# Patient Record
Sex: Female | Born: 1950
Health system: Southern US, Community
[De-identification: ages and names within clinical notes are randomized; demographics above are authoritative.]

## PROBLEM LIST (undated history)

## (undated) DIAGNOSIS — G4700 Insomnia, unspecified: Secondary | ICD-10-CM

## (undated) DIAGNOSIS — G309 Alzheimer's disease, unspecified: Secondary | ICD-10-CM

## (undated) DIAGNOSIS — F32A Depression, unspecified: Secondary | ICD-10-CM

## (undated) DIAGNOSIS — F0280 Dementia in other diseases classified elsewhere without behavioral disturbance: Secondary | ICD-10-CM

## (undated) DIAGNOSIS — F419 Anxiety disorder, unspecified: Secondary | ICD-10-CM

## (undated) DIAGNOSIS — J449 Chronic obstructive pulmonary disease, unspecified: Secondary | ICD-10-CM

## (undated) DIAGNOSIS — F329 Major depressive disorder, single episode, unspecified: Secondary | ICD-10-CM

## (undated) HISTORY — PX: CHOLECYSTECTOMY: SHX55

## (undated) HISTORY — DX: Insomnia, unspecified: G47.00

## (undated) HISTORY — DX: Anxiety disorder, unspecified: F41.9

## (undated) HISTORY — PX: ROTATOR CUFF REPAIR: SHX139

## (undated) HISTORY — DX: Alzheimer's disease, unspecified: G30.9

## (undated) HISTORY — PX: OTHER SURGICAL HISTORY: SHX169

## (undated) HISTORY — DX: Dementia in other diseases classified elsewhere without behavioral disturbance: F02.80

## (undated) HISTORY — PX: ABDOMINAL HYSTERECTOMY: SHX81

## (undated) HISTORY — DX: Major depressive disorder, single episode, unspecified: F32.9

## (undated) HISTORY — DX: Chronic obstructive pulmonary disease, unspecified: J44.9

## (undated) HISTORY — PX: APPENDECTOMY: SHX54

## (undated) HISTORY — DX: Depression, unspecified: F32.A

---

## 2006-05-20 DIAGNOSIS — F0280 Dementia in other diseases classified elsewhere without behavioral disturbance: Secondary | ICD-10-CM

## 2006-05-20 HISTORY — DX: Dementia in other diseases classified elsewhere, unspecified severity, without behavioral disturbance, psychotic disturbance, mood disturbance, and anxiety: F02.80

## 2010-08-29 LAB — HM MAMMOGRAPHY: HM Mammogram: NEGATIVE

## 2010-08-31 LAB — HM DEXA SCAN

## 2011-05-21 DIAGNOSIS — J449 Chronic obstructive pulmonary disease, unspecified: Secondary | ICD-10-CM

## 2011-05-21 HISTORY — DX: Chronic obstructive pulmonary disease, unspecified: J44.9

## 2013-07-26 ENCOUNTER — Encounter: Payer: Self-pay | Admitting: Internal Medicine

## 2013-07-26 ENCOUNTER — Ambulatory Visit (INDEPENDENT_AMBULATORY_CARE_PROVIDER_SITE_OTHER): Payer: 59 | Admitting: Internal Medicine

## 2013-07-26 VITALS — BP 124/82 | HR 61 | Temp 98.3°F | Resp 14 | Ht 62.25 in | Wt 179.8 lb

## 2013-07-26 DIAGNOSIS — W19XXXA Unspecified fall, initial encounter: Secondary | ICD-10-CM

## 2013-07-26 DIAGNOSIS — E538 Deficiency of other specified B group vitamins: Secondary | ICD-10-CM

## 2013-07-26 DIAGNOSIS — F32A Depression, unspecified: Secondary | ICD-10-CM | POA: Insufficient documentation

## 2013-07-26 DIAGNOSIS — F3289 Other specified depressive episodes: Secondary | ICD-10-CM

## 2013-07-26 DIAGNOSIS — Z13220 Encounter for screening for lipoid disorders: Secondary | ICD-10-CM

## 2013-07-26 DIAGNOSIS — G309 Alzheimer's disease, unspecified: Secondary | ICD-10-CM

## 2013-07-26 DIAGNOSIS — F410 Panic disorder [episodic paroxysmal anxiety] without agoraphobia: Secondary | ICD-10-CM | POA: Insufficient documentation

## 2013-07-26 DIAGNOSIS — G4700 Insomnia, unspecified: Secondary | ICD-10-CM

## 2013-07-26 DIAGNOSIS — R634 Abnormal weight loss: Secondary | ICD-10-CM

## 2013-07-26 DIAGNOSIS — J4489 Other specified chronic obstructive pulmonary disease: Secondary | ICD-10-CM

## 2013-07-26 DIAGNOSIS — F0280 Dementia in other diseases classified elsewhere without behavioral disturbance: Secondary | ICD-10-CM

## 2013-07-26 DIAGNOSIS — G300 Alzheimer's disease with early onset: Secondary | ICD-10-CM

## 2013-07-26 DIAGNOSIS — F329 Major depressive disorder, single episode, unspecified: Secondary | ICD-10-CM

## 2013-07-26 DIAGNOSIS — M159 Polyosteoarthritis, unspecified: Secondary | ICD-10-CM | POA: Insufficient documentation

## 2013-07-26 DIAGNOSIS — J449 Chronic obstructive pulmonary disease, unspecified: Secondary | ICD-10-CM | POA: Insufficient documentation

## 2013-07-26 MED ORDER — SERTRALINE HCL 50 MG PO TABS: 50.0000 mg | ORAL_TABLET | Freq: Every day | ORAL | Status: DC

## 2013-07-26 NOTE — Progress Notes (Signed)
Patient ID: Sherri StammerJacqueline Degan, female   DOB: 09/01/1950, 63 y.o.   MRN: [id number]   [facility]  [facility]  Code Status: DNR, brought HCPOA and living will paperwork to be scanned   Allergies  Allergen Reactions  . Codone [Hydrocodone]     Chief Complaint  Patient presents with  . Establish Care    NP Establish Care  . Immunizations    will wait on medical records  . other    having anxiety attacks, worsening dementia, plans on moving in to Assisted living-needs FL-2 form fillied out/DNR  . other    depression/MMSE done    HPI: Patient is a 63 y.o. female seen in the office today to establish with the practice.  She moved here at Thanksgiving.  She needs to establish.  Is agreeable to going to [facility] at the end of March.  Needs DNR, FL2, PPD.  Has been having what seem to be anxiety attacks.  Will be sitting on the cough, start shaking, will get something sweet to eat (b/c husband had been diabetic) and has been so worked up, she fell a couple of times.  Reviewed neuropsychological eval.  Weight loss had been an issue.  She had not been eating well for a 1.3728yrs by herself.  Looks better and healthier.  Sometimes does feel anxious.  Has some crying episodes and mood swings.  Facial expression with change immediately--angry at times.  She can't control it.  Sometimes appropriate mood for the moment.  Big adjustment due to rules of the house.  Keeps emotional response, but does not remember what about.  With trazodone, it seems she is sleeping at night.  No longer wandering.  Goes to bed b/w 91030 and gets up at 730.  Had a fall 2/21, January and once last week.  Had a swollen hand in August, Sept.  No fracture, but swollen for 2 mos.  Has bad oa of hands.    Had been in [facility] when her son was young--had back surgery and had disc problem and had surgery on her head, as well.  Both rotator cuffs replaced.  Also had carpal tunnel surgery.    COPD dx'd  2 years ago--had seen pulmonology.  No longer smoking since she's been here.  Got thru an infection this past season with dayquil.  Says she aches everywhere.  Massages hands and will look at them like they hurt.  Has been taking advil at [facility] for a week.    Has living will, hcpoa paperwork that was copied to be scanned.  Is clearly requesting DNR.    Has a sitter right now 6 hrs per day.  Feels like she is alone a long time.  When it was getting dark early, that made things worse.    Review of Systems:  Review of Systems  Constitutional: Positive for weight loss. Negative for fever and chills.  HENT: Negative for congestion and hearing loss.   Eyes: Negative for blurred vision.  Respiratory: Negative for cough and shortness of breath.   Cardiovascular: Negative for chest pain, palpitations and leg swelling.  Gastrointestinal: Positive for heartburn and constipation. Negative for abdominal pain, blood in stool and melena.  Genitourinary: Negative for dysuria, urgency and frequency.  Musculoskeletal: Positive for falls.  Skin: Negative for rash.  Neurological: Positive for sensory change. Negative for focal weakness, loss of consciousness, weakness and headaches.  Endo/Heme/Allergies: Does not bruise/bleed easily.  Psychiatric/Behavioral: Positive for depression and memory  loss. The patient is nervous/anxious and has insomnia.     Past Medical History  Diagnosis Date  . Anxiety   . Depression   . Alzheimer disease 2008  . Insomnia   . COPD (chronic obstructive pulmonary disease) 2013    Past Surgical History  Procedure Laterality Date  . Rotator cuff repair    . Carpeal tunnel repair    . Cholecystectomy    . Appendectomy    . Abdominal hysterectomy      Social History:   reports that she has never smoked. She has never used smokeless tobacco. She reports that she does not drink alcohol or use illicit drugs.  Family History  Problem Relation Age of Onset  . Leukemia Mother    . Stroke Father     Medications: Patient's Medications  New Prescriptions   No medications on file  Previous Medications   CYANOCOBALAMIN 2000 MCG TABLET    2,000 mcg. Taking 3000 mcg once daily for Vitamin B-12 Deficiency.   DONEPEZIL (ARICEPT) 10 MG TABLET    Take 1 tablet by mouth twice daily for Alzheimer's Disease   MEMANTINE (NAMENDA) 10 MG TABLET    Take 1 tablet by mouth twice daily for Alzheimer's Disease   SERTRALINE (ZOLOFT) 100 MG TABLET    Take 1 tablet daily for Anxiety Disorder   TRAZODONE (DESYREL) 100 MG TABLET    100 mg. Take 2 tablets at night for Insomnia  Modified Medications   No medications on file  Discontinued Medications   No medications on file     Physical Exam: Filed Vitals:   07/26/13 0920  BP: 124/82  Pulse: 61  Temp: 98.3 F (36.8 C)  TempSrc: Oral  Resp: 14  Height: 5' 2.25" (1.581 m)  Weight: 179 lb 12.8 oz (81.557 kg)  SpO2: 98%  Physical Exam  Constitutional: She appears well-developed and well-nourished. No distress.  HENT:  Head: Normocephalic and atraumatic.  Right Ear: External ear normal.  Left Ear: External ear normal.  Nose: Nose normal.  Mouth/Throat: Oropharynx is clear and moist. No oropharyngeal exudate.  Only mild cerumen left ear  Eyes: Conjunctivae and EOM are normal. Pupils are equal, round, and reactive to light.  Neck: Neck supple. No JVD present. No thyromegaly present.  Cardiovascular: Normal rate, regular rhythm, normal heart sounds and intact distal pulses.   Pulmonary/Chest: Effort normal and breath sounds normal. No respiratory distress.  Abdominal: Soft. Bowel sounds are normal. She exhibits no distension and no mass. There is no tenderness.  Genitourinary:  No suprapubic tenderness  Musculoskeletal: Normal range of motion. She exhibits no edema and no tenderness.  Deformities of hands bilaterally with heberden's and bouchard's nodes present, left thumb "sticks"   Lymphadenopathy:    She has no cervical  adenopathy.  Neurological: She is alert. No cranial nerve deficit.  Oriented to person only  Skin: Skin is warm and dry.  Psychiatric: She has a normal mood and affect.    Labs reviewed: None available today  Past Procedures: Had mammo and bone density and no longer plan to do these  Assessment/Plan 1. Panic attacks -remain problematic with 100mg  of zoloft--she tends to get up quickly and grab something sweet to eat thinking it is her sugar -will add 50mg  tab to the 100mg  tab for 150mg  daily and see how she does with that - sertraline (ZOLOFT) 50 MG tablet; Take 1 tablet (50 mg total) by mouth daily. Take this with the 100mg  tablet  Dispense: 90 tablet;  Refill: 3  2. Alzheimer's disease, early onset -severe--requiring assistance with all adls even set up for meals at this point, seems to have some aphasia, also -is moving to [facility] end of this month for care as she is requiring supervision almost 24x7 right now and her son and daughter in law work (currently paying a caretaker) - Comprehensive metabolic panel - PPD placed and will be read in 48-72 hrs -cont aricept and namenda (may need to switch to XR when current supply deplete)  3. Insomnia -cont trazodone--only using one tablet at this point with success  4. COPD (chronic obstructive pulmonary disease) - diagnosed by pulmonology in the past--not on any treatments, but makes respiratory infections worse and sometimes requires steroids - CBC with Differential - PPD  5. Depression -increase to zoloft 150mg  daily  6. Generalized osteoarthritis of multiple sites -discussed using tylenol es instead of advil for pain b/c she is needing it regularly  7. B12 deficiency - B12 and Folate Panel -cont daily supplement, but need level to determine if she is absorbing the oral version properly  8. Loss of weight - more than likely due to inability to prep her own food previously when living alone, but has gained again since  living with her son and daughter-in-law - Hemoglobin A1c - TSH  9. Screening, lipid -cannot check lipids today due to having had coffee with cream and sugar--will check at a future visit or order at facility to be faxed to me  10. Falls -in context of anxiety attacks -may benefit from some PT at the facility to help with gait stability and coping during these attacks  Labs/tests ordered:  Orders Placed This Encounter  Procedures  . HM MAMMOGRAPHY    This external order was created through the Results Console.  Marland Kitchen HM DEXA SCAN    This external order was created through the Results Console.  . B12 and Folate Panel  . CBC with Differential  . Comprehensive metabolic panel  . Hemoglobin A1c  . TSH    Next appt: get lipids next time or at facility;  6 wks re: panic attacks and adjustment to [facility]

## 2013-07-26 NOTE — Progress Notes (Signed)
Failed clock drawing  

## 2013-07-26 NOTE — Patient Instructions (Signed)
I recommend tylenol in place of advil (so her kidneys and stomach are protected).

## 2013-07-27 LAB — TSH: TSH: 0.772 u[IU]/mL (ref 0.450–4.500)

## 2013-07-27 LAB — HEMOGLOBIN A1C
Est. average glucose Bld gHb Est-mCnc: 120 mg/dL
Hgb A1c MFr Bld: 5.8 % — ABNORMAL HIGH (ref 4.8–5.6)

## 2013-07-27 LAB — CBC WITH DIFFERENTIAL/PLATELET
Basophils Absolute: 0 10*3/uL (ref 0.0–0.2)
Basos: 0 %
Eos: 3 %
Eosinophils Absolute: 0.2 10*3/uL (ref 0.0–0.4)
HCT: 42.2 % (ref 34.0–46.6)
Hemoglobin: 13.6 g/dL (ref 11.1–15.9)
Immature Grans (Abs): 0 10*3/uL (ref 0.0–0.1)
Immature Granulocytes: 0 %
Lymphocytes Absolute: 2.3 10*3/uL (ref 0.7–3.1)
Lymphs: 39 %
MCH: 28.6 pg (ref 26.6–33.0)
MCHC: 32.2 g/dL (ref 31.5–35.7)
MCV: 89 fL (ref 79–97)
Monocytes Absolute: 0.4 10*3/uL (ref 0.1–0.9)
Monocytes: 6 %
Neutrophils Absolute: 3 10*3/uL (ref 1.4–7.0)
Neutrophils Relative %: 52 %
RBC: 4.75 x10E6/uL (ref 3.77–5.28)
RDW: 14.1 % (ref 12.3–15.4)
WBC: 5.8 10*3/uL (ref 3.4–10.8)

## 2013-07-27 LAB — COMPREHENSIVE METABOLIC PANEL
ALT: 11 IU/L (ref 0–32)
AST: 18 IU/L (ref 0–40)
Albumin/Globulin Ratio: 2.3 (ref 1.1–2.5)
Albumin: 4.4 g/dL (ref 3.6–4.8)
Alkaline Phosphatase: 67 IU/L (ref 39–117)
BUN/Creatinine Ratio: 16 (ref 11–26)
BUN: 13 mg/dL (ref 8–27)
CO2: 22 mmol/L (ref 18–29)
Calcium: 9.2 mg/dL (ref 8.7–10.3)
Chloride: 106 mmol/L (ref 97–108)
Creatinine, Ser: 0.81 mg/dL (ref 0.57–1.00)
GFR calc Af Amer: 89 mL/min/{1.73_m2} (ref >59)
GFR calc non Af Amer: 78 mL/min/{1.73_m2} (ref >59)
Globulin, Total: 1.9 g/dL (ref 1.5–4.5)
Glucose: 84 mg/dL (ref 65–99)
Potassium: 4.9 mmol/L (ref 3.5–5.2)
Sodium: 145 mmol/L — ABNORMAL HIGH (ref 134–144)
Total Bilirubin: 0.3 mg/dL (ref 0.0–1.2)
Total Protein: 6.3 g/dL (ref 6.0–8.5)

## 2013-07-27 LAB — B12 AND FOLATE PANEL
Folate: 13.6 ng/mL (ref >3.0)
Vitamin B-12: >1999 pg/mL — ABNORMAL HIGH (ref 211–946)

## 2013-07-29 LAB — TB SKIN TEST
Induration: 0 mm
TB Skin Test: NEGATIVE

## 2013-08-19 ENCOUNTER — Other Ambulatory Visit: Payer: Self-pay | Admitting: *Deleted

## 2013-09-03 ENCOUNTER — Encounter: Payer: Self-pay | Admitting: Internal Medicine

## 2013-09-03 ENCOUNTER — Ambulatory Visit (INDEPENDENT_AMBULATORY_CARE_PROVIDER_SITE_OTHER): Payer: 59 | Admitting: Internal Medicine

## 2013-09-03 VITALS — BP 122/74 | HR 66 | Temp 99.0°F | Wt 189.0 lb

## 2013-09-03 DIAGNOSIS — F3289 Other specified depressive episodes: Secondary | ICD-10-CM

## 2013-09-03 DIAGNOSIS — G300 Alzheimer's disease with early onset: Principal | ICD-10-CM

## 2013-09-03 DIAGNOSIS — F410 Panic disorder [episodic paroxysmal anxiety] without agoraphobia: Secondary | ICD-10-CM

## 2013-09-03 DIAGNOSIS — M159 Polyosteoarthritis, unspecified: Secondary | ICD-10-CM

## 2013-09-03 DIAGNOSIS — G4700 Insomnia, unspecified: Secondary | ICD-10-CM | POA: Insufficient documentation

## 2013-09-03 DIAGNOSIS — F0280 Dementia in other diseases classified elsewhere without behavioral disturbance: Secondary | ICD-10-CM

## 2013-09-03 DIAGNOSIS — Z13220 Encounter for screening for lipoid disorders: Secondary | ICD-10-CM

## 2013-09-03 DIAGNOSIS — F329 Major depressive disorder, single episode, unspecified: Secondary | ICD-10-CM

## 2013-09-03 DIAGNOSIS — F32A Depression, unspecified: Secondary | ICD-10-CM

## 2013-09-03 DIAGNOSIS — G309 Alzheimer's disease, unspecified: Secondary | ICD-10-CM

## 2013-09-03 NOTE — Progress Notes (Signed)
Patient ID: Sherri Moses, female   DOB: 01/13/1951, 63 y.o.   MRN: [id number]   [facility]  Code Status: has DNR, living will, hcpoa in record   Allergies  Allergen Reactions  . Codone [Hydrocodone]     Chief Complaint  Patient presents with  . Medical Managment of Chronic Issues    6 week follow-up, discuss Trazodone     HPI: Patient is a 63 y.o. seen in the office today for medical mgt of chronic diseases.  Has moved into [facility].    Likes [facility].   Gave her her medication, but gave her her trazodone in the morning.  No concerns of not sleeping at night.  Mood has been good also.  Has been happy every day when they visited her.  Is eating welll there and gained almost 10 lbs since visit 6 wks ago.  Had not been eating at all before moving in with family.    Does have appt with Dr. Vonna KotykBevis upcoming for cataracts that were just discovered.  Review of Systems:  Review of Systems  Constitutional: Negative for fever.  HENT: Negative for congestion.   Eyes: Positive for blurred vision.       Cataracts  Respiratory: Negative for shortness of breath.   Cardiovascular: Negative for chest pain and palpitations.  Gastrointestinal: Negative for abdominal pain, blood in stool and melena.  Genitourinary: Negative for dysuria.  Musculoskeletal: Negative for falls.       Since last visit  Skin: Negative for rash.  Neurological: Negative for dizziness, loss of consciousness and headaches.  Endo/Heme/Allergies: Bruises/bleeds easily.  Psychiatric/Behavioral: Positive for memory loss. Negative for depression. The patient is not nervous/anxious and does not have insomnia.      Past Medical History  Diagnosis Date  . Anxiety   . Depression   . Alzheimer disease 2008  . Insomnia   . COPD (chronic obstructive pulmonary disease) 2013    Past Surgical History  Procedure Laterality Date  . Rotator cuff repair    . Carpeal  tunnel repair    . Cholecystectomy    . Appendectomy    . Abdominal hysterectomy      Social History:   reports that she quit smoking about 5 months ago. She has never used smokeless tobacco. She reports that she does not drink alcohol or use illicit drugs.  Family History  Problem Relation Age of Onset  . Leukemia Mother   . Stroke Father     Medications: Patient's Medications  New Prescriptions   No medications on file  Previous Medications   CYANOCOBALAMIN 2000 MCG TABLET    2,000 mcg. Taking 3000 mcg once daily for Vitamin B-12 Deficiency.   DONEPEZIL (ARICEPT) 10 MG TABLET    Take 1 tablet by mouth twice daily for Alzheimer's Disease   MEMANTINE (NAMENDA) 10 MG TABLET    Take 1 tablet by mouth twice daily for Alzheimer's Disease   SERTRALINE (ZOLOFT) 100 MG TABLET    Take 1 tablet daily for Anxiety Disorder   SERTRALINE (ZOLOFT) 50 MG TABLET    Take 1 tablet (50 mg total) by mouth daily. Take this with the 100mg  tablet   TRAZODONE (DESYREL) 100 MG TABLET    100 mg. Take 2 tablets at night for Insomnia  Modified Medications   No medications on file  Discontinued Medications   No medications on file     Physical Exam: Filed Vitals:   09/03/13 [id number]  BP: 122/74  Pulse: 66  Temp: 99 F (37.2 C)  TempSrc: Oral  Weight: 189 lb (85.73 kg)  SpO2: 98%  Physical Exam  Constitutional: She appears well-developed and well-nourished. No distress.  Cardiovascular: Normal rate, regular rhythm, normal heart sounds and intact distal pulses.   Pulmonary/Chest: Effort normal and breath sounds normal. No respiratory distress.  Abdominal: Soft. Bowel sounds are normal. She exhibits no distension and no mass. There is no tenderness.  Musculoskeletal: Normal range of motion.  Left thumb with interphalangeal joint that sticks  Neurological: She is alert.  Difficulty with word finding, does not speak much  Skin: Skin is warm and dry.  Psychiatric: She has a normal mood and affect.     Labs reviewed: Basic Metabolic Panel:  Recent Labs  16/02/9602/09/15 1054  NA 145*  K 4.9  CL 106  CO2 22  GLUCOSE 84  BUN 13  CREATININE 0.81  CALCIUM 9.2  TSH 0.772   Liver Function Tests:  Recent Labs  07/26/13 1054  AST 18  ALT 11  ALKPHOS 67  BILITOT 0.3  PROT 6.3   CBC:  Recent Labs  07/26/13 1054  WBC 5.8  NEUTROABS 3.0  HGB 13.6  HCT 42.2  MCV 89   Lab Results  Component Value Date   HGBA1C 5.8* 07/26/2013    Labs reviewed with pt and her son today.  Assessment/Plan 1. Alzheimer's disease, early onset -doing very well with namenda and aricept -is now living at [facility] and enjoying it -no changes needed  2. Panic attacks -resolved at this point   3. Insomnia -sleeping well with trazodone  4. Depression -mood well controlled with zoloft and hs trazodone only -for some reason was getting trazodone in daytime, too, but we are unclear why b/c it was not ordered that way here in epic  5. Generalized osteoarthritis of multiple sites -arthritis doing well--no complaints except thumb sticks sometimes--advised tylenol use  6. Screening, lipid -checked today  Labs/tests ordered:   Orders Placed This Encounter  Procedures  . Lipid panel    Order Specific Question:  Has the patient fasted?    Answer:  Yes   Next appt:  3 mos

## 2013-09-04 LAB — LIPID PANEL
Chol/HDL Ratio: 2.8 ratio units (ref 0.0–4.4)
Cholesterol, Total: 177 mg/dL (ref 100–199)
HDL: 64 mg/dL (ref >39)
LDL Calculated: 97 mg/dL (ref 0–99)
Triglycerides: 78 mg/dL (ref 0–149)
VLDL Cholesterol Cal: 16 mg/dL (ref 5–40)

## 2013-10-21 ENCOUNTER — Telehealth: Payer: Self-pay | Admitting: *Deleted

## 2013-10-21 NOTE — Telephone Encounter (Signed)
Received a incident fax report from [facility] stating the resident continues to complain of being sore and stiff. To please consider x-ray of Right shoulder and right hip as resident continues to walk with a limp/altered gait. Unsure what happened, Resident stated that she tripped. Her son states she says she fell for attention but unless someone see's her fall she most likely did not and states it is most likely her arthritis and not to do x-rays.   Per Dr. Kristeen Mans facility and ask them if patient's symptoms still persist and if so do X-Rays and make an appointment, if not we will not do anything. I called facility and spoke with the nurse and she stated that patient is fine and to disregard.

## 2013-11-16 ENCOUNTER — Telehealth: Payer: Self-pay | Admitting: *Deleted

## 2013-11-16 NOTE — Telephone Encounter (Signed)
LMOM to return call.

## 2013-11-16 NOTE — Telephone Encounter (Signed)
Albertha GheeKristen Aldredge,RN and daughter called and stated that her mom is having hip and back pain. [facility] and no report noted of falling. Patient has Advil but it is not working and daughter wants to know if something stronger can be ordered and faxed to the facility for patient to try for 3-5 days to see if it will help. Daughter states that she is an Char[employer] fundraiserN and nothing is broken. And there are no available appointments. Please Advise.

## 2013-11-16 NOTE — Telephone Encounter (Signed)
Please notify Sherri CruiseKristin:  Let's try mobic 7.5mg  daily (give with food if she gets GI upset).  If this is inadequate, it can be increased to 15mg  daily.  She has an allergy to hydrocodone.  We will need to fax the mobic script to [facility].

## 2013-11-17 ENCOUNTER — Telehealth: Payer: Self-pay | Admitting: *Deleted

## 2013-11-17 HISTORY — PX: CATARACT EXTRACTION, BILATERAL: SHX1313

## 2013-11-17 MED ORDER — MELOXICAM 7.5 MG PO TABS: 7.5000 mg | ORAL_TABLET | Freq: Every day | ORAL | Status: DC

## 2013-11-17 NOTE — Telephone Encounter (Signed)
Returned phone message left by Synetta FailAnita on Tuesday, Belenda CruiseKristin stated that she understood the directions of trying Mobic 7.5 mg by Dr. Renato Gailseed. She will get back with Dr. Renato Gailseed if the medication needs to be increased.

## 2013-11-24 NOTE — Telephone Encounter (Signed)
Jasmine DecemberSharon addressed phone message

## 2013-12-03 ENCOUNTER — Ambulatory Visit: Payer: 59 | Admitting: Internal Medicine

## 2013-12-31 ENCOUNTER — Ambulatory Visit: Payer: 59 | Admitting: Internal Medicine

## 2014-01-20 ENCOUNTER — Ambulatory Visit: Payer: 59 | Admitting: Internal Medicine

## 2014-04-06 ENCOUNTER — Telehealth: Payer: Self-pay | Admitting: *Deleted

## 2014-04-06 NOTE — Telephone Encounter (Signed)
Nurse with [facility] called and stated that patient twisted her ankle and it is swollen. Offered an appointment with Shanda BumpsJessica tomorrow but refused and stated that she would fax an order over for OTC pain med to be given as needed.

## 2014-04-07 NOTE — Telephone Encounter (Signed)
[facility]. States Resident appears to have sprained ankle. Resident has full ROM and ran bear weight, but has facial grimace and complains of pain with palpation. Lateral maleolus right non pitting edema but pulse equal bilateral. Son refuses urgent care visit and we have been using ice and elevation. Karen Huhta we please have order for PRN ibuprofen to relieve pain?  Order given to Dr. Renato Gailseed to address.

## 2014-04-12 ENCOUNTER — Ambulatory Visit (INDEPENDENT_AMBULATORY_CARE_PROVIDER_SITE_OTHER): Payer: BC Managed Care – PPO | Admitting: Internal Medicine

## 2014-04-12 ENCOUNTER — Ambulatory Visit
Admission: RE | Admit: 2014-04-12 | Discharge: 2014-04-12 | Disposition: A | Discharge status: Home / Self Care | Payer: 59 | Source: Ambulatory Visit | Attending: Internal Medicine | Admitting: Internal Medicine

## 2014-04-12 ENCOUNTER — Encounter: Payer: Self-pay | Admitting: Internal Medicine

## 2014-04-12 VITALS — BP 138/88 | HR 68 | Temp 98.5°F | Resp 18

## 2014-04-12 DIAGNOSIS — M79671 Pain in right foot: Secondary | ICD-10-CM

## 2014-04-12 MED ORDER — NAPROXEN 500 MG PO TABS: 500.0000 mg | ORAL_TABLET | Freq: Two times a day (BID) | ORAL | Status: DC

## 2014-04-12 NOTE — Progress Notes (Signed)
Patient ID: Sherri StammerJacqueline Aul, female   DOB: 07/15/1950, 63 y.o.   MRN: [id number]    Chief Complaint  Patient presents with  . Acute Visit    last wed. limping, blue around toes,side of foot, elvation swelling seen   Allergies  Allergen Reactions  . Codone [Hydrocodone]    HPI 63 y/o female patient is here with her caregiver for acute concerns. She resides in [facility]. She had a fall a week back (unwitnessed) after which she was noted to be limping in her room. She was in pain and unable to bear weight. Her right ankle was swollen. Her son did not want her to go to the ED or urgent care. She continues to have pain, taking prn ibuprofen without much help. Staff mentions noticing bluish discoloration on her right toes and her foot has swollen now.   ROS She has been transferred on wheelchair Has not been bearing weight for now Has dementia making her history taking and ROS difficult Unable to grade her pain at present for me She tells me that she is a walker No fever or chills as per staff Compliant with her meds  Past Medical History  Diagnosis Date  . Anxiety   . Depression   . Alzheimer disease 2008  . Insomnia   . COPD (chronic obstructive pulmonary disease) 2013   Current Outpatient Prescriptions on File Prior to Visit  Medication Sig Dispense Refill  . donepezil (ARICEPT) 10 MG tablet Take 1 tablet by mouth twice daily for Alzheimer's Disease    . meloxicam (MOBIC) 7.5 MG tablet Take 1 tablet (7.5 mg total) by mouth daily. Take 7.5 mg by mouth daily. Give with food if patient has a upset stomach. 30 tablet 0  . memantine (NAMENDA) 10 MG tablet Take 1 tablet by mouth twice daily for Alzheimer's Disease    . sertraline (ZOLOFT) 100 MG tablet Take 1 tablet daily for Anxiety Disorder    . sertraline (ZOLOFT) 50 MG tablet Take 1 tablet (50 mg total) by mouth daily. Take this with the 100mg  tablet 90 tablet 3  . traZODone (DESYREL) 100 MG tablet 100 mg. Take 2 tablets at night  for Insomnia     No current facility-administered medications on file prior to visit.    Physical exam BP 138/88 mmHg  Pulse 68  Temp(Src) 98.5 F (36.9 C) (Oral)  Resp 18  SpO2 97%  Constitutional: She appears well-developed and well-nourished. No distress.  Cardiovascular: Normal rate, regular rhythm, normal heart sounds and intact distal pulses.   Pulmonary/Chest: Effort normal and breath sounds normal. No respiratory distress.  Musculoskeletal: right foot and ankle swollen, tenderness on removal of socks from her right foot, tenderness in lateral malleolus. No erythema. Bluish discoloration of right 3rd and 4th toe. Good dorsalis pedis. Rotation at ankle with inversion and eversion illicits pain. Unable to make her walk in the office Neurological: She appears alert but does not participate in conversation, baseline confusion from dementia Skin: Skin is warm and dry.   Assessment/plan 1. Right foot pain Post trauma from fall. With her limited ROM, inability to bear weight due to pain, concern for fracture. Will get xray of right foot and ankle, not to bear weight until xray reviewed. Ice pack, rest nd naproxen 500 mg bid for now. D/c ibuprofen.  - DG Ankle Complete Right; Future

## 2014-04-12 NOTE — Patient Instructions (Signed)

## 2014-04-13 ENCOUNTER — Telehealth: Payer: Self-pay | Admitting: *Deleted

## 2014-04-13 NOTE — Telephone Encounter (Signed)
Called and spoke with patient and informed them of all result's.

## 2014-06-02 ENCOUNTER — Emergency Department (HOSPITAL_COMMUNITY)
Admission: EM | Admit: 2014-06-02 | Discharge: 2014-06-02 | Disposition: A | Discharge status: Other Acute Inpatient Hospital | Payer: BLUE CROSS/BLUE SHIELD | Attending: Emergency Medicine | Admitting: Emergency Medicine

## 2014-06-02 ENCOUNTER — Emergency Department (HOSPITAL_COMMUNITY): Payer: BLUE CROSS/BLUE SHIELD

## 2014-06-02 ENCOUNTER — Encounter (HOSPITAL_COMMUNITY): Payer: Self-pay

## 2014-06-02 DIAGNOSIS — F329 Major depressive disorder, single episode, unspecified: Secondary | ICD-10-CM | POA: Insufficient documentation

## 2014-06-02 DIAGNOSIS — Z791 Long term (current) use of non-steroidal anti-inflammatories (NSAID): Secondary | ICD-10-CM | POA: Insufficient documentation

## 2014-06-02 DIAGNOSIS — F419 Anxiety disorder, unspecified: Secondary | ICD-10-CM | POA: Insufficient documentation

## 2014-06-02 DIAGNOSIS — F0281 Dementia in other diseases classified elsewhere with behavioral disturbance: Secondary | ICD-10-CM | POA: Insufficient documentation

## 2014-06-02 DIAGNOSIS — J159 Unspecified bacterial pneumonia: Secondary | ICD-10-CM | POA: Diagnosis not present

## 2014-06-02 DIAGNOSIS — Z043 Encounter for examination and observation following other accident: Secondary | ICD-10-CM | POA: Insufficient documentation

## 2014-06-02 DIAGNOSIS — W19XXXA Unspecified fall, initial encounter: Secondary | ICD-10-CM

## 2014-06-02 DIAGNOSIS — J45909 Unspecified asthma, uncomplicated: Secondary | ICD-10-CM | POA: Diagnosis not present

## 2014-06-02 DIAGNOSIS — G309 Alzheimer's disease, unspecified: Secondary | ICD-10-CM | POA: Diagnosis not present

## 2014-06-02 DIAGNOSIS — Z87891 Personal history of nicotine dependence: Secondary | ICD-10-CM | POA: Insufficient documentation

## 2014-06-02 DIAGNOSIS — Z79899 Other long term (current) drug therapy: Secondary | ICD-10-CM | POA: Diagnosis not present

## 2014-06-02 DIAGNOSIS — Y998 Other external cause status: Secondary | ICD-10-CM | POA: Insufficient documentation

## 2014-06-02 DIAGNOSIS — Y9289 Other specified places as the place of occurrence of the external cause: Secondary | ICD-10-CM | POA: Insufficient documentation

## 2014-06-02 DIAGNOSIS — Y9389 Activity, other specified: Secondary | ICD-10-CM | POA: Insufficient documentation

## 2014-06-02 DIAGNOSIS — W1839XA Other fall on same level, initial encounter: Secondary | ICD-10-CM | POA: Insufficient documentation

## 2014-06-02 DIAGNOSIS — J189 Pneumonia, unspecified organism: Secondary | ICD-10-CM

## 2014-06-02 DIAGNOSIS — R55 Syncope and collapse: Secondary | ICD-10-CM | POA: Diagnosis present

## 2014-06-02 LAB — CBC WITH DIFFERENTIAL/PLATELET
BASOS ABS: 0 10*3/uL (ref 0.0–0.1)
Basophils Relative: 0 % (ref 0–1)
Eosinophils Absolute: 0.1 10*3/uL (ref 0.0–0.7)
Eosinophils Relative: 1 % (ref 0–5)
HCT: 43.5 % (ref 36.0–46.0)
Hemoglobin: 13.7 g/dL (ref 12.0–15.0)
LYMPHS ABS: 2.2 10*3/uL (ref 0.7–4.0)
Lymphocytes Relative: 26 % (ref 12–46)
MCH: 28 pg (ref 26.0–34.0)
MCHC: 31.5 g/dL (ref 30.0–36.0)
MCV: 88.8 fL (ref 78.0–100.0)
Monocytes Absolute: 0.5 10*3/uL (ref 0.1–1.0)
Monocytes Relative: 6 % (ref 3–12)
NEUTROS PCT: 67 % (ref 43–77)
Neutro Abs: 5.5 10*3/uL (ref 1.7–7.7)
Platelets: 223 10*3/uL (ref 150–400)
RBC: 4.9 MIL/uL (ref 3.87–5.11)
RDW: 14.1 % (ref 11.5–15.5)
WBC: 8.4 10*3/uL (ref 4.0–10.5)

## 2014-06-02 LAB — URINALYSIS, ROUTINE W REFLEX MICROSCOPIC
Bilirubin Urine: NEGATIVE
GLUCOSE, UA: NEGATIVE mg/dL
Hgb urine dipstick: NEGATIVE
Ketones, ur: NEGATIVE mg/dL
Leukocytes, UA: NEGATIVE
Nitrite: NEGATIVE
PH: 6.5 (ref 5.0–8.0)
Protein, ur: NEGATIVE mg/dL
Specific Gravity, Urine: 1.012 (ref 1.005–1.030)
Urobilinogen, UA: 0.2 mg/dL (ref 0.0–1.0)

## 2014-06-02 LAB — I-STAT TROPONIN, ED: Troponin i, poc: 0 ng/mL (ref 0.00–0.08)

## 2014-06-02 LAB — COMPREHENSIVE METABOLIC PANEL
ALT: 11 U/L (ref 0–35)
ANION GAP: 8 (ref 5–15)
AST: 17 U/L (ref 0–37)
Albumin: 4 g/dL (ref 3.5–5.2)
Alkaline Phosphatase: 58 U/L (ref 39–117)
BUN: 11 mg/dL (ref 6–23)
CALCIUM: 8.8 mg/dL (ref 8.4–10.5)
CHLORIDE: 107 meq/L (ref 96–112)
CO2: 26 mmol/L (ref 19–32)
Creatinine, Ser: 0.68 mg/dL (ref 0.50–1.10)
GFR calc non Af Amer: >90 mL/min (ref >90)
GLUCOSE: 99 mg/dL (ref 70–99)
Potassium: 4.1 mmol/L (ref 3.5–5.1)
Sodium: 141 mmol/L (ref 135–145)
Total Bilirubin: 0.3 mg/dL (ref 0.3–1.2)
Total Protein: 6.7 g/dL (ref 6.0–8.3)

## 2014-06-02 MED ORDER — LEVOFLOXACIN 500 MG PO TABS: 500.0000 mg | ORAL_TABLET | Freq: Every day | ORAL | Status: DC

## 2014-06-02 MED ORDER — SODIUM CHLORIDE 0.9 % IV BOLUS (SEPSIS): 500.0000 mL | Freq: Once | INTRAVENOUS | Status: DC

## 2014-06-02 MED ORDER — SODIUM CHLORIDE 0.9 % IV BOLUS (SEPSIS)
1000.0000 mL | Freq: Once | INTRAVENOUS | Status: AC
  Administered 2014-06-02: 1000 mL via INTRAVENOUS

## 2014-06-02 MED ORDER — LEVOFLOXACIN 500 MG PO TABS
500.0000 mg | ORAL_TABLET | Freq: Once | ORAL | Status: AC
  Administered 2014-06-02: 500 mg via ORAL
  Filled 2014-06-02: qty 1

## 2014-06-02 MED ORDER — LEVOFLOXACIN IN D5W 750 MG/150ML IV SOLN: 750.0000 mg | Freq: Once | INTRAVENOUS | Status: DC

## 2014-06-02 NOTE — ED Notes (Signed)
Pt in restroom with RN.

## 2014-06-02 NOTE — Discharge Instructions (Signed)
Take levaquin 500 mg daily for a week.   Stay hydrated.  Fall precautions.   Follow up with your doctor.   Return to ER if she passed out, vomiting, fevers.

## 2014-06-02 NOTE — ED Provider Notes (Signed)
CSN: [id number]     Arrival date & time 06/02/14  [id number] History   First MD Initiated Contact with Patient 06/02/14 [phone]     Chief Complaint  Patient presents with  . Near Syncope     (Consider location/radiation/quality/duration/timing/severity/associated sxs/prior Treatment) The history is provided by the nursing home and the EMS personnel.  Sherri Moses is a [age] y.o. female hx of dementia, COPD, here presenting with near syncope. [facility]. She was standing up from a sitting position and almost passed out twice. Per the nursing home she lowered herself down and sat on her buttocks but did not hit her head. She is baseline A and O x 1. She was noted to have blood pressure dropped from 160 to 130 when she stood up and was given 500 mL bolus by EMS. Patient unable to give history. Not on blood thinners.   Level V caveat- dementia    Past Medical History  Diagnosis Date  . Anxiety   . Depression   . Alzheimer disease 2008  . Insomnia   . COPD (chronic obstructive pulmonary disease) 2013   Past Surgical History  Procedure Laterality Date  . Rotator cuff repair    . Carpeal tunnel repair    . Cholecystectomy    . Appendectomy    . Abdominal hysterectomy     Family History  Problem Relation Age of Onset  . Leukemia Mother   . Stroke Father    History  Substance Use Topics  . Smoking status: Former Smoker -- 40 years    Quit date: 03/20/2013  . Smokeless tobacco: Never Used  . Alcohol Use: No   OB History    No data available     Review of Systems  Unable to perform ROS: Dementia  Cardiovascular: Positive for near-syncope.      Allergies  Codone  Home Medications   Prior to Admission medications   Medication Sig Start Date End Date Taking? Authorizing Provider  acetaminophen (TYLENOL) 500 MG tablet Take 500 mg by mouth 3 (three) times daily as needed for moderate pain or headache.   Yes Historical Provider, MD  donepezil  (ARICEPT) 10 MG tablet Take 1 tablet by mouth twice daily for Alzheimer's Disease   Yes Historical Provider, MD  meloxicam (MOBIC) 7.5 MG tablet Take 1 tablet (7.5 mg total) by mouth daily. Take 7.5 mg by mouth daily. Give with food if patient has a upset stomach. 11/17/13  Yes Mahima Pandey, MD  memantine (NAMENDA) 10 MG tablet Take 1 tablet by mouth twice daily for Alzheimer's Disease   Yes Historical Provider, MD  sertraline (ZOLOFT) 100 MG tablet Take 150 mg by mouth daily. Take 1 tablet daily for Anxiety Disorder   Yes Historical Provider, MD  traZODone (DESYREL) 100 MG tablet 100 mg. Take 2 tablets at night for Insomnia   Yes Historical Provider, MD  Trolamine Salicylate (ARTHRICREAM EX) Apply 1 application topically 4 (four) times daily as needed (to bilateral knees for arthritic pain).   Yes Historical Provider, MD  naproxen (NAPROSYN) 500 MG tablet Take 1 tablet (500 mg total) by mouth 2 (two) times daily with a meal. Patient not taking: Reported on 06/02/2014 04/12/14   Oneal GroutMahima Pandey, MD  sertraline (ZOLOFT) 50 MG tablet Take 1 tablet (50 mg total) by mouth daily. Take this with the 100mg  tablet Patient not taking: Reported on 06/02/2014 07/26/13   Tiffany L Reed, DO   BP 164/145 mmHg  Pulse  92  Temp(Src) 98.2 F (36.8 C) (Oral)  Resp 16  SpO2 99% Physical Exam  Constitutional:  Demented, NAD   HENT:  Head: Normocephalic and atraumatic.  Mouth/Throat: Oropharynx is clear and moist.  Eyes: Conjunctivae are normal. Pupils are equal, round, and reactive to light.  Neck: Normal range of motion. Neck supple.  Cardiovascular: Normal rate, regular rhythm and normal heart sounds.   Pulmonary/Chest: Effort normal and breath sounds normal. No respiratory distress. She has no wheezes. She has no rales.  Abdominal: Soft. Bowel sounds are normal. She exhibits no distension. There is no tenderness. There is no rebound.  Musculoskeletal: Normal range of motion. She exhibits no edema or tenderness.   Pelvis stable. No midline spinal tenderness   Neurological: She is alert.  Demented, CN 2-12 intact. Moving all extremities   Skin: Skin is warm and dry.  Psychiatric:  Unable   Nursing note and vitals reviewed.   ED Course  Procedures (including critical care time) Labs Review Labs Reviewed  URINE CULTURE  CBC WITH DIFFERENTIAL  COMPREHENSIVE METABOLIC PANEL  URINALYSIS, ROUTINE W REFLEX MICROSCOPIC  I-STAT TROPOININ, ED    Imaging Review Dg Chest 1 View  06/02/2014   CLINICAL DATA:  Bilateral hip pain.  Two episodes of near syncope.  EXAM: CHEST - 1 VIEW  COMPARISON:  None.  FINDINGS: The heart size is normal. Atherosclerotic changes are noted at the aortic arch. Mild left basilar airspace disease is noted. The lung volumes are low. The visualized soft tissues and bony thorax are unremarkable. Postsurgical changes are noted at the left shoulder.  IMPRESSION: 1. Ill-defined left basilar airspace disease. While this may represent atelectasis, infection is also considered. 2. Low lung volumes.   Electronically Signed   By: Gennette Pac M.D.   On: 06/02/2014 10:31   Dg Pelvis 1-2 Views  06/02/2014   CLINICAL DATA:  Two episodes of near syncope. Two falls upon standing from a sitting position. Bilateral hip pain. Initial encounter.  EXAM: PELVIS - 1-2 VIEW  COMPARISON:  None.  FINDINGS: No acute fracture is identified. The hips appear normally located on this single AP projection. Hip joint space widths are relatively well preserved. No lytic or blastic osseous lesion is seen. Calcifications in the pelvis likely represent phleboliths.  IMPRESSION: No acute osseous abnormality identified.   Electronically Signed   By: Sebastian Ache   On: 06/02/2014 10:23     EKG Interpretation   Date/Time:  Thursday June 02 2014 09:36:43 EST Ventricular Rate:  71 PR Interval:  155 QRS Duration: 82 QT Interval:  415 QTC Calculation: 451 R Axis:   56 Text Interpretation:  Sinus rhythm No  previous ECGs available Confirmed by  YAO  MD, DAVID ([id number]) on 06/02/2014 9:50:28 AM      MDM   Final diagnoses:  Fall    Sherri Moses is a 64 y.o. female here with near syncope. Will get orthostatics, labs, EKG, UA, CXR. Will reassess after fluids.    12:25 PM Orthostatic initially. Given IVF and orthostasis improved. CXR showed possible pneumonia. She ripped out her IV. Will give levaquin PO. Not septic appearing and never hypoxic or hypotensive. Will d/c back to facility on levaquin.    Richardean Canal, MD 06/02/14 1226

## 2014-06-02 NOTE — ED Notes (Signed)
Per EMS[facility]  Pt had 2 episodes of near syncope.  Stands from sitting position and fell x 2.  Pt has dementia.  B/P orthostatic changes by EMS.  Fluid bolus 500cc in route.  Per staff, pt fell down but did not strike head.  Pt acting at baseline.  No blood thinners.  Systolic 160 sitting to 130 standing.  Vitals: 116/81, hr 70,, resp 16, cbg 88, 96% ra.  IV LAC 20 g.

## 2014-06-02 NOTE — ED Notes (Signed)
Bed: [id number] Expected date:  Expected time:  Means of arrival:  Comments: EMS FALL

## 2014-06-02 NOTE — ED Notes (Signed)
Called for transport

## 2014-06-04 LAB — URINE CULTURE

## 2014-07-01 ENCOUNTER — Encounter: Payer: Self-pay | Admitting: Internal Medicine

## 2014-07-01 ENCOUNTER — Ambulatory Visit (INDEPENDENT_AMBULATORY_CARE_PROVIDER_SITE_OTHER): Payer: BLUE CROSS/BLUE SHIELD | Admitting: Internal Medicine

## 2014-07-01 VITALS — BP 132/84 | HR 73 | Temp 98.6°F | Ht 62.0 in | Wt 212.0 lb

## 2014-07-01 DIAGNOSIS — F329 Major depressive disorder, single episode, unspecified: Secondary | ICD-10-CM | POA: Diagnosis not present

## 2014-07-01 DIAGNOSIS — M75101 Unspecified rotator cuff tear or rupture of right shoulder, not specified as traumatic: Secondary | ICD-10-CM

## 2014-07-01 DIAGNOSIS — M159 Polyosteoarthritis, unspecified: Secondary | ICD-10-CM | POA: Diagnosis not present

## 2014-07-01 DIAGNOSIS — G300 Alzheimer's disease with early onset: Secondary | ICD-10-CM | POA: Diagnosis not present

## 2014-07-01 DIAGNOSIS — F32A Depression, unspecified: Secondary | ICD-10-CM

## 2014-07-01 DIAGNOSIS — F0280 Dementia in other diseases classified elsewhere without behavioral disturbance: Secondary | ICD-10-CM

## 2014-07-01 MED ORDER — SERTRALINE HCL 50 MG PO TABS: 50.0000 mg | ORAL_TABLET | Freq: Every day | ORAL | Status: DC

## 2014-07-01 MED ORDER — DULOXETINE HCL 30 MG PO CPEP: 30.0000 mg | ORAL_CAPSULE | Freq: Every day | ORAL | Status: DC

## 2014-07-01 MED ORDER — SERTRALINE HCL 100 MG PO TABS: 100.0000 mg | ORAL_TABLET | Freq: Every day | ORAL | Status: DC

## 2014-07-01 MED ORDER — DULOXETINE HCL 60 MG PO CPEP: 60.0000 mg | ORAL_CAPSULE | Freq: Every day | ORAL | Status: DC

## 2014-07-01 NOTE — Progress Notes (Signed)
Patient ID: Sherri Moses, female   DOB: [birth year], 64 y.o.   MRN: [id number]   Location:  [facility]   Allergies  Allergen Reactions  . Codone [Hydrocodone]     Chief Complaint  Patient presents with  . Acute Visit    Problem with legs giving out and falling. Patient fell yesterday (ni injury).     HPI: Patient is a 64 y.o. white female seen in the office today for an acute visit due to a problem with her legs giving out and she falls.  She's been falling frequently.  Had one fall before the holidays around Thanksgiving and twisted her ankle badly and it was quite swollen.  Fell yesterday.  It was witnessed and looked like her legs gave out.  Also had another fall about a month ago that was unwitnessed--occurred by med tech station, got her up, then she fell again in her room.  They sent her to the ER and they saw nothing wrong.  Orthostatics were done in the ER.  Previously did fall a few times after she'd been sitting.  Was given abx for possible pneumonia.  Falls also seem to always happen first thing in the am.  They did have a chair alarm when she had fallen with her foot propped up.    Continues with knee pain, low back pain and arthritis in hands.  Right shoulder hurts her some--uses topical nsaid.  Says she hurts all over.      Has been on zoloft for a long time, but only otherwise was on benzos.  Mood has been worse and she had one week of being angry at everyone, but that went away.    Review of Systems:  Review of Systems  Constitutional: Positive for malaise/fatigue. Negative for fever and chills.  HENT: Negative for hearing loss.   Cardiovascular: Positive for leg swelling.  Gastrointestinal: Negative for abdominal pain.  Genitourinary: Negative for dysuria.  Musculoskeletal: Positive for myalgias, back pain, joint pain and falls.  Skin: Negative for rash.  Neurological: Positive for dizziness. Negative for weakness and  headaches.  Endo/Heme/Allergies: Bruises/bleeds easily.  Psychiatric/Behavioral: Positive for depression and memory loss. The patient is nervous/anxious and has insomnia.      Past Medical History  Diagnosis Date  . Anxiety   . Depression   . Alzheimer disease 2008  . Insomnia   . COPD (chronic obstructive pulmonary disease) 2013    Past Surgical History  Procedure Laterality Date  . Rotator cuff repair    . Carpeal tunnel repair    . Cholecystectomy    . Appendectomy    . Abdominal hysterectomy    . Cataract extraction, bilateral  July 2015    Social History:   reports that she quit smoking about 15 months ago. She has never used smokeless tobacco. She reports that she does not drink alcohol or use illicit drugs.  Family History  Problem Relation Age of Onset  . Leukemia Mother   . Stroke Father     Medications: Patient's Medications  New Prescriptions   No medications on file  Previous Medications   ACETAMINOPHEN (TYLENOL) 500 MG TABLET    Take 500 mg by mouth 3 (three) times daily as needed for moderate pain or headache.   DONEPEZIL (ARICEPT) 10 MG TABLET    Take 1 tablet by mouth twice daily for Alzheimer's Disease   MELOXICAM (MOBIC) 7.5 MG TABLET    Take 1 tablet (7.5 mg  total) by mouth daily. Take 7.5 mg by mouth daily. Give with food if patient has a upset stomach.   MEMANTINE (NAMENDA) 10 MG TABLET    Take 1 tablet by mouth twice daily for Alzheimer's Disease   SERTRALINE (ZOLOFT) 100 MG TABLET    Take 150 mg by mouth daily. Take 1 1/2 tablet daily for Anxiety Disorder   TRAZODONE (DESYREL) 150 MG TABLET    Take 150 mg by mouth at bedtime. 1 by mouth at bedtime for insomnia   TROLAMINE SALICYLATE (ARTHRICREAM EX)    Apply 1 application topically 4 (four) times daily as needed (to bilateral knees for arthritic pain).  Modified Medications   No medications on file  Discontinued Medications   LEVOFLOXACIN (LEVAQUIN) 500 MG TABLET    Take 1 tablet (500 mg total)  by mouth daily.   NAPROXEN (NAPROSYN) 500 MG TABLET    Take 1 tablet (500 mg total) by mouth 2 (two) times daily with a meal.   SERTRALINE (ZOLOFT) 50 MG TABLET    Take 1 tablet (50 mg total) by mouth daily. Take this with the 100mg  tablet   TRAZODONE (DESYREL) 100 MG TABLET    100 mg. Take 2 tablets at night for Insomnia     Physical Exam: Filed Vitals:   07/01/14 0900  BP: 132/84  Pulse: 73  Temp: 98.6 F (37 C)  TempSrc: Oral  Height: 5\' 2"  (1.575 m)  Weight: 212 lb (96.163 kg)  SpO2: 98%  Physical Exam  Constitutional: She appears well-developed and well-nourished.  Cardiovascular: Normal rate and regular rhythm.   Pulmonary/Chest: Effort normal and breath sounds normal.  Musculoskeletal:  Uncomfortable walking--limps on right knee/hip; right shoulder abduction is limited and she has a positive drop arm test  Neurological: She is alert.  Did not speak much at all today, tearful off and on  Psychiatric:  Tearful, admits to sadness    Labs reviewed: Basic Metabolic Panel:  Recent Labs  13/12/6501/09/15 1054 06/02/14 0951  NA 145* 141  K 4.9 4.1  CL 106 107  CO2 22 26  GLUCOSE 84 99  BUN 13 11  CREATININE 0.81 0.68  CALCIUM 9.2 8.8  TSH 0.772  --    Liver Function Tests:  Recent Labs  07/26/13 1054 06/02/14 0951  AST 18 17  ALT 11 11  ALKPHOS 67 58  BILITOT 0.3 0.3  PROT 6.3 6.7  ALBUMIN  --  4.0   No results for input(s): LIPASE, AMYLASE in the last 8760 hours. No results for input(s): AMMONIA in the last 8760 hours. CBC:  Recent Labs  07/26/13 1054 06/02/14 0951  WBC 5.8 8.4  NEUTROABS 3.0 5.5  HGB 13.6 13.7  HCT 42.2 43.5  MCV 89 88.8  PLT  --  223   Lipid Panel:  Recent Labs  09/03/13 0859  HDL 64  LDLCALC 97  TRIG 78  CHOLHDL 2.8   Lab Results  Component Value Date   HGBA1C 5.8* 07/26/2013     Assessment/Plan 1. Right rotator cuff tear -likely happened in one of her falls -her family agrees with a referral to ortho for  further imaging potentially and injection for relief -I don't want her to lose function of that arm b/c that will further alter her balance and gait - AMB referral to orthopedics  2. Depression -not doing well with zoloft any longer -moods worse lately -will try switching to cymbalta -taper off zoloft (100mg  for 3 days, 50mg  for 3 days, then  stop), then start cymbalta  for 2 wks, then  thereafter--all prescriptions printed for facility and instructions written out and put in envelope and copy given to her son, as well -will reassess at her annual exam appt  3. Alzheimer's disease, early onset -cont aricept and namenda -may consider change to namzaric if will not be costly to family  4. Generalized osteoarthritis of multiple sites -cont tylenol prn, mobic daily, topical arthritis cream -especially of low back, right hip, right knee and hands  Labs/tests ordered:  Will reassess at annual exam Next appt:  Keep scheduled physical  Gerrie Castiglia L. Alizza Sacra, D.O. Geriatrics [facility] [address], [place] [address] Cell Phone (Mon-Fri 8am-5pm):  [phone] On Call:  [phone] & follow prompts after 5pm & weekends Office Phone:  [phone] Office Fax:  [phone]

## 2014-07-15 ENCOUNTER — Ambulatory Visit: Payer: 59 | Admitting: Internal Medicine

## 2014-08-18 ENCOUNTER — Encounter: Payer: Self-pay | Admitting: Internal Medicine

## 2014-08-18 ENCOUNTER — Ambulatory Visit (INDEPENDENT_AMBULATORY_CARE_PROVIDER_SITE_OTHER): Payer: BLUE CROSS/BLUE SHIELD | Admitting: Internal Medicine

## 2014-08-18 ENCOUNTER — Ambulatory Visit: Payer: BLUE CROSS/BLUE SHIELD | Admitting: Internal Medicine

## 2014-08-18 VITALS — BP 128/72 | HR 74 | Temp 98.3°F | Resp 16 | Ht 62.0 in | Wt 216.0 lb

## 2014-08-18 DIAGNOSIS — M19041 Primary osteoarthritis, right hand: Secondary | ICD-10-CM | POA: Diagnosis not present

## 2014-08-18 DIAGNOSIS — M19042 Primary osteoarthritis, left hand: Secondary | ICD-10-CM

## 2014-08-18 DIAGNOSIS — M159 Polyosteoarthritis, unspecified: Secondary | ICD-10-CM | POA: Diagnosis not present

## 2014-08-18 DIAGNOSIS — F329 Major depressive disorder, single episode, unspecified: Secondary | ICD-10-CM | POA: Diagnosis not present

## 2014-08-18 DIAGNOSIS — F32A Depression, unspecified: Secondary | ICD-10-CM

## 2014-08-18 DIAGNOSIS — Z23 Encounter for immunization: Secondary | ICD-10-CM

## 2014-08-18 DIAGNOSIS — M75101 Unspecified rotator cuff tear or rupture of right shoulder, not specified as traumatic: Secondary | ICD-10-CM

## 2014-08-18 DIAGNOSIS — F0280 Dementia in other diseases classified elsewhere without behavioral disturbance: Secondary | ICD-10-CM

## 2014-08-18 DIAGNOSIS — G300 Alzheimer's disease with early onset: Secondary | ICD-10-CM | POA: Diagnosis not present

## 2014-08-18 MED ORDER — TRAMADOL HCL 50 MG PO TABS: 50.0000 mg | ORAL_TABLET | Freq: Two times a day (BID) | ORAL | Status: DC

## 2014-08-18 MED ORDER — ZOSTER VACCINE LIVE 19400 UNT/0.65ML ~~LOC~~ SOLR
0.6500 mL | Freq: Once | SUBCUTANEOUS | Status: DC
Start: 2014-08-18 — End: 2014-10-14

## 2014-08-18 NOTE — Progress Notes (Signed)
Patient ID: Sherri StammerJacqueline Shouse, female   DOB: 08/08/1950, 64 y.o.   MRN: [id number]   Location:  [facility]  Code Status: DNR Advanced Directive information Does patient have an advance directive?: Yes, Type of Advance Directive: Healthcare Power of WinnAttorney;Living will, Does patient want to make changes to advanced directive?: No - Patient declined  Allergies  Allergen Reactions  . Codone [Hydrocodone]     Chief Complaint  Patient presents with  . Medical Management of Chronic Issues    3 month follow-up, doing a lot better on medications     HPI: Patient is a 64 y.o. female seen in the office today for med mgt of chronic diseases.  She is doing a lot better in terms of her mood since zoloft was tapered off and cymbalta started.  Her pain is also improved except in her hands which still hurt all of the time.  Her son notes she is also participating some more in activities.  She is clearly improved b/c she is speaking to me and smiling--last time she sat with her head down and looked sad and uncomfortable.   Review of Systems:  Review of Systems  Constitutional: Negative for fever.  HENT: Negative for congestion.   Respiratory: Negative for shortness of breath.   Cardiovascular: Positive for leg swelling. Negative for chest pain.  Gastrointestinal: Negative for abdominal pain and constipation.  Genitourinary: Negative for dysuria.  Musculoskeletal: Positive for joint pain. Negative for back pain and falls.  Skin: Negative for rash.  Neurological: Positive for weakness. Negative for dizziness and loss of consciousness.  Psychiatric/Behavioral: Positive for depression and memory loss.       Improved     Past Medical History  Diagnosis Date  . Anxiety   . Depression   . Alzheimer disease 2008  . Insomnia   . COPD (chronic obstructive pulmonary disease) 2013    Past Surgical History  Procedure Laterality Date  . Rotator cuff repair     . Carpeal tunnel repair    . Cholecystectomy    . Appendectomy    . Abdominal hysterectomy    . Cataract extraction, bilateral  July 2015    Social History:   reports that she quit smoking about 16 months ago. She has never used smokeless tobacco. She reports that she does not drink alcohol or use illicit drugs.  Family History  Problem Relation Age of Onset  . Leukemia Mother   . Stroke Father     Medications: Patient's Medications  New Prescriptions   No medications on file  Previous Medications   ACETAMINOPHEN (TYLENOL) 500 MG TABLET    Take 500 mg by mouth 3 (three) times daily as needed for moderate pain or headache.   DONEPEZIL (ARICEPT) 10 MG TABLET    Take 1 tablet by mouth twice daily for Alzheimer's Disease   DULOXETINE (CYMBALTA) 60 MG CAPSULE    Take 1 capsule (60 mg total) by mouth daily.   MELOXICAM (MOBIC) 7.5 MG TABLET    Take 1 tablet (7.5 mg total) by mouth daily. Take 7.5 mg by mouth daily. Give with food if patient has a upset stomach.   MEMANTINE (NAMENDA) 10 MG TABLET    Take 1 tablet by mouth twice daily for Alzheimer's Disease   TRAZODONE (DESYREL) 150 MG TABLET    Take 150 mg by mouth at bedtime. 1 by mouth at bedtime for insomnia   TROLAMINE SALICYLATE (ARTHRICREAM EX)    Apply  1 application topically 4 (four) times daily as needed (to bilateral knees for arthritic pain).  Modified Medications   Modified Medication Previous Medication   ZOSTER VACCINE LIVE, PF, (ZOSTAVAX) 16109 UNT/0.65ML INJECTION zoster vaccine live, PF, (ZOSTAVAX) 60454 UNT/0.65ML injection      Inject 19,400 Units into the skin once.    Inject 0.65 mLs into the skin once.  Discontinued Medications   DULOXETINE (CYMBALTA) 30 MG CAPSULE    Take 1 capsule (30 mg total) by mouth daily.   SERTRALINE (ZOLOFT) 100 MG TABLET    Take 1 tablet (100 mg total) by mouth daily. For 3 days   SERTRALINE (ZOLOFT) 50 MG TABLET    Take 1 tablet (50 mg total) by mouth daily. For 3 days      Physical Exam: Filed Vitals:   08/18/14 0817  BP: 128/72  Pulse: 74  Temp: 98.3 F (36.8 C)  TempSrc: Oral  Resp: 16  Height:  (1.575 m)  Weight: 216 lb (97.977 kg)  SpO2: 96%  Physical Exam  Constitutional: She appears well-developed and well-nourished.  Cardiovascular: Normal rate, regular rhythm, normal heart sounds and intact distal pulses.   Nonpitting edema bilaterally  Pulmonary/Chest: Effort normal and breath sounds normal.  Abdominal: Soft. Bowel sounds are normal.  Musculoskeletal: Normal range of motion. She exhibits tenderness.  Inflammation of mcps bilaterally  Neurological: She is alert.  Skin: Skin is warm and dry.  Psychiatric: She has a normal mood and affect.  Smiling and interacting today    Labs reviewed: Basic Metabolic Panel:  Recent Labs  09/81/19 0951  NA 141  K 4.1  CL 107  CO2 26  GLUCOSE 99  BUN 11  CREATININE 0.68  CALCIUM 8.8   Liver Function Tests:  Recent Labs  06/02/14 0951  AST 17  ALT 11  ALKPHOS 58  BILITOT 0.3  PROT 6.7  ALBUMIN 4.0   No results for input(s): LIPASE, AMYLASE in the last 8760 hours. No results for input(s): AMMONIA in the last 8760 hours. CBC:  Recent Labs  06/02/14 0951  WBC 8.4  NEUTROABS 5.5  HGB 13.7  HCT 43.5  MCV 88.8  PLT 223   Lipid Panel:  Recent Labs  09/03/13 0859  CHOL 177  HDL 64  LDLCALC 97  TRIG 78  CHOLHDL 2.8   Lab Results  Component Value Date   HGBA1C 5.8* 07/26/2013   Assessment/Plan 1. Primary osteoarthritis of both hands - cont mobic daily, tylenol  po tid prn, but will add scheduled tramadol bid and monitor for serotonin syndrome - traMADol (ULTRAM) 50 MG tablet; Take 1 tablet (50 mg total) by mouth 2 (two) times daily.  Dispense: 60 tablet; Refill: 3  2. Right rotator cuff tear -no further imaging was done and I don't see where she went to ortho about this either  3. Generalized osteoarthritis of multiple sites -especially of her  hands, knees, lower back -improved except hands and voltaren didn't help -will try scheduling tramadol bid  4. Depression -cont cymbalta  5. Alzheimer's disease, early onset -cont memory care at [facility] -not on meds based on family opinion not to have her on them due to skilled nursing needs at this time  Need for zoster:  Rx provided to get at [facility]  Labs/tests ordered:  No new Next appt:  3 mos  Roshini Fulwider L. Latrell Reitan, D.O. Geriatrics [facility] [address], [place] [address] Cell Phone (Mon-Fri 8am-5pm):  [phone]  On Call:  [phone] & follow prompts after 5pm & weekends Office Phone:  [phone] Office Fax:  [phone]

## 2014-08-25 ENCOUNTER — Telehealth: Payer: Self-pay | Admitting: *Deleted

## 2014-08-25 NOTE — Telephone Encounter (Signed)
Josette, the nurse at [facility] called needing a order for UA , she stated the patien had increased confusion and agitation. Dr. Renato Gailseed sent a order for a UA w/ reflex urine culture.

## 2014-10-14 ENCOUNTER — Encounter (HOSPITAL_COMMUNITY): Payer: Self-pay | Admitting: Emergency Medicine

## 2014-10-14 ENCOUNTER — Emergency Department (HOSPITAL_COMMUNITY): Payer: Medicare Other

## 2014-10-14 ENCOUNTER — Emergency Department (HOSPITAL_COMMUNITY)
Admission: EM | Admit: 2014-10-14 | Discharge: 2014-10-14 | Disposition: A | Discharge status: Home / Self Care | Payer: Medicare Other | Attending: Emergency Medicine | Admitting: Emergency Medicine

## 2014-10-14 DIAGNOSIS — R5601 Complex febrile convulsions: Secondary | ICD-10-CM | POA: Diagnosis not present

## 2014-10-14 DIAGNOSIS — M542 Cervicalgia: Secondary | ICD-10-CM | POA: Diagnosis not present

## 2014-10-14 DIAGNOSIS — R569 Unspecified convulsions: Secondary | ICD-10-CM | POA: Diagnosis not present

## 2014-10-14 DIAGNOSIS — G4700 Insomnia, unspecified: Secondary | ICD-10-CM | POA: Insufficient documentation

## 2014-10-14 DIAGNOSIS — F329 Major depressive disorder, single episode, unspecified: Secondary | ICD-10-CM | POA: Diagnosis not present

## 2014-10-14 DIAGNOSIS — Y998 Other external cause status: Secondary | ICD-10-CM | POA: Diagnosis not present

## 2014-10-14 DIAGNOSIS — W1839XA Other fall on same level, initial encounter: Secondary | ICD-10-CM | POA: Insufficient documentation

## 2014-10-14 DIAGNOSIS — Y92128 Other place in nursing home as the place of occurrence of the external cause: Secondary | ICD-10-CM | POA: Diagnosis not present

## 2014-10-14 DIAGNOSIS — Z79899 Other long term (current) drug therapy: Secondary | ICD-10-CM | POA: Insufficient documentation

## 2014-10-14 DIAGNOSIS — G309 Alzheimer's disease, unspecified: Secondary | ICD-10-CM | POA: Insufficient documentation

## 2014-10-14 DIAGNOSIS — Z791 Long term (current) use of non-steroidal anti-inflammatories (NSAID): Secondary | ICD-10-CM | POA: Diagnosis not present

## 2014-10-14 DIAGNOSIS — Z87891 Personal history of nicotine dependence: Secondary | ICD-10-CM | POA: Insufficient documentation

## 2014-10-14 DIAGNOSIS — J449 Chronic obstructive pulmonary disease, unspecified: Secondary | ICD-10-CM | POA: Diagnosis not present

## 2014-10-14 DIAGNOSIS — S199XXA Unspecified injury of neck, initial encounter: Secondary | ICD-10-CM | POA: Diagnosis not present

## 2014-10-14 DIAGNOSIS — F419 Anxiety disorder, unspecified: Secondary | ICD-10-CM | POA: Diagnosis not present

## 2014-10-14 DIAGNOSIS — N390 Urinary tract infection, site not specified: Secondary | ICD-10-CM | POA: Diagnosis not present

## 2014-10-14 DIAGNOSIS — F0280 Dementia in other diseases classified elsewhere without behavioral disturbance: Secondary | ICD-10-CM | POA: Diagnosis not present

## 2014-10-14 DIAGNOSIS — S0990XA Unspecified injury of head, initial encounter: Secondary | ICD-10-CM | POA: Diagnosis not present

## 2014-10-14 DIAGNOSIS — Y9389 Activity, other specified: Secondary | ICD-10-CM | POA: Diagnosis not present

## 2014-10-14 DIAGNOSIS — W19XXXA Unspecified fall, initial encounter: Secondary | ICD-10-CM

## 2014-10-14 LAB — COMPREHENSIVE METABOLIC PANEL
ALT: 19 U/L (ref 14–54)
AST: 22 U/L (ref 15–41)
Albumin: 3.6 g/dL (ref 3.5–5.0)
Alkaline Phosphatase: 61 U/L (ref 38–126)
Anion gap: 9 (ref 5–15)
BILIRUBIN TOTAL: 0.4 mg/dL (ref 0.3–1.2)
BUN: 11 mg/dL (ref 6–20)
CO2: 26 mmol/L (ref 22–32)
Calcium: 8.7 mg/dL — ABNORMAL LOW (ref 8.9–10.3)
Chloride: 106 mmol/L (ref 101–111)
Creatinine, Ser: 0.76 mg/dL (ref 0.44–1.00)
GFR calc non Af Amer: >60 mL/min (ref >60)
Glucose, Bld: 96 mg/dL (ref 65–99)
Potassium: 3.8 mmol/L (ref 3.5–5.1)
Sodium: 141 mmol/L (ref 135–145)
Total Protein: 6.1 g/dL — ABNORMAL LOW (ref 6.5–8.1)

## 2014-10-14 LAB — CBC WITH DIFFERENTIAL/PLATELET
Basophils Absolute: 0 10*3/uL (ref 0.0–0.1)
Basophils Relative: 0 % (ref 0–1)
EOS PCT: 3 % (ref 0–5)
Eosinophils Absolute: 0.2 10*3/uL (ref 0.0–0.7)
HCT: 42.1 % (ref 36.0–46.0)
HEMOGLOBIN: 13.9 g/dL (ref 12.0–15.0)
LYMPHS ABS: 1.8 10*3/uL (ref 0.7–4.0)
LYMPHS PCT: 29 % (ref 12–46)
MCH: 28.7 pg (ref 26.0–34.0)
MCHC: 33 g/dL (ref 30.0–36.0)
MCV: 87 fL (ref 78.0–100.0)
MONOS PCT: 8 % (ref 3–12)
Monocytes Absolute: 0.5 10*3/uL (ref 0.1–1.0)
NEUTROS ABS: 3.8 10*3/uL (ref 1.7–7.7)
Neutrophils Relative %: 60 % (ref 43–77)
PLATELETS: 200 10*3/uL (ref 150–400)
RBC: 4.84 MIL/uL (ref 3.87–5.11)
RDW: 13.4 % (ref 11.5–15.5)
WBC: 6.3 10*3/uL (ref 4.0–10.5)

## 2014-10-14 LAB — URINE MICROSCOPIC-ADD ON

## 2014-10-14 LAB — URINALYSIS, ROUTINE W REFLEX MICROSCOPIC
BILIRUBIN URINE: NEGATIVE
GLUCOSE, UA: NEGATIVE mg/dL
Ketones, ur: NEGATIVE mg/dL
Nitrite: NEGATIVE
Protein, ur: NEGATIVE mg/dL
Specific Gravity, Urine: 1.02 (ref 1.005–1.030)
Urobilinogen, UA: 0.2 mg/dL (ref 0.0–1.0)
pH: 6.5 (ref 5.0–8.0)

## 2014-10-14 MED ORDER — CEPHALEXIN 500 MG PO CAPS: 500.0000 mg | ORAL_CAPSULE | Freq: Four times a day (QID) | ORAL | Status: DC

## 2014-10-14 NOTE — ED Notes (Addendum)
Per EMS, pt is from [facility]. Staff reports that the pt walked out of her room this morning and displayed a "twitching" movement in her arms that appeared to be seizure-like - pt remained alert and standing through this episode. Staff states that the pt then lost her balance and fell into a sheet rock wall. Pt complaining of neck pain in scene, and placed in a c-collar. Staff reported that the pt was then post-ictal. Pt with hx of dementia. Unsure of baseline mental status. CBG: 99. Slightly hypertensive for EMS at 156/90

## 2014-10-14 NOTE — ED Provider Notes (Signed)
CSN: [id number]     Arrival date & time 10/14/14  0601 History   First MD Initiated Contact with Patient 10/14/14 [phone]     No chief complaint on file.    (Consider location/radiation/quality/duration/timing/severity/associated sxs/prior Treatment) Patient is a 64 y.o. female presenting with fall. The history is provided by the patient, the EMS personnel and the nursing home. No language interpreter was used.  Fall This is a new problem. The current episode started today. The problem occurs constantly. The problem has been gradually worsening. Associated symptoms include myalgias. Pertinent negatives include no nausea, neck pain, visual change or vomiting. Nothing aggravates the symptoms. She has tried nothing for the symptoms. The treatment provided moderate relief.  Pt is reported to have been standing jerking her arms and then fell and hit her head and neck.  Pt denies any complaints  Past Medical History  Diagnosis Date  . Anxiety   . Depression   . Alzheimer disease 2008  . Insomnia   . COPD (chronic obstructive pulmonary disease) 2013   Past Surgical History  Procedure Laterality Date  . Rotator cuff repair    . Carpeal tunnel repair    . Cholecystectomy    . Appendectomy    . Abdominal hysterectomy    . Cataract extraction, bilateral  July 2015   Family History  Problem Relation Age of Onset  . Leukemia Mother   . Stroke Father    History  Substance Use Topics  . Smoking status: Former Smoker -- 40 years    Quit date: 03/20/2013  . Smokeless tobacco: Never Used  . Alcohol Use: No   OB History    No data available     Review of Systems  Gastrointestinal: Negative for nausea and vomiting.  Musculoskeletal: Positive for myalgias. Negative for neck pain.  All other systems reviewed and are negative.     Allergies  Codone  Home Medications   Prior to Admission medications   Medication Sig Start Date End Date Taking? Authorizing Provider  acetaminophen  (TYLENOL) 500 MG tablet Take 500 mg by mouth 3 (three) times daily as needed for moderate pain or headache.    Historical Provider, MD  donepezil (ARICEPT) 10 MG tablet Take 1 tablet by mouth twice daily for Alzheimer's Disease    Historical Provider, MD  DULoxetine (CYMBALTA) 60 MG capsule Take 1 capsule (60 mg total) by mouth daily. 07/01/14   Tiffany L Reed, DO  meloxicam (MOBIC) 7.5 MG tablet Take 1 tablet (7.5 mg total) by mouth daily. Take 7.5 mg by mouth daily. Give with food if patient has a upset stomach. 11/17/13   Oneal Grout, MD  memantine (NAMENDA) 10 MG tablet Take 1 tablet by mouth twice daily for Alzheimer's Disease    Historical Provider, MD  traMADol (ULTRAM) 50 MG tablet Take 1 tablet (50 mg total) by mouth 2 (two) times daily. 08/18/14   Tiffany L Reed, DO  traZODone (DESYREL) 150 MG tablet Take 150 mg by mouth at bedtime. 1 by mouth at bedtime for insomnia 06/23/14   Historical Provider, MD  Trolamine Salicylate (ARTHRICREAM EX) Apply 1 application topically 4 (four) times daily as needed (to bilateral knees for arthritic pain).    Historical Provider, MD  zoster vaccine live, PF, (ZOSTAVAX) 56213 UNT/0.65ML injection Inject 19,400 Units into the skin once. 08/18/14   Tiffany L Reed, DO   There were no vitals taken for this visit. Physical Exam  Constitutional: She appears well-developed and well-nourished.  HENT:  Head: Normocephalic.  Right Ear: External ear normal.  Left Ear: External ear normal.  Nose: Nose normal.  Mouth/Throat: Oropharynx is clear and moist.  Eyes: Conjunctivae and EOM are normal. Pupils are equal, round, and reactive to light.  Neck: Normal range of motion. Neck supple.  Cardiovascular: Normal rate and regular rhythm.   Pulmonary/Chest: Effort normal.  Abdominal: Soft. Bowel sounds are normal.  Musculoskeletal: Normal range of motion.  Neurological: She is alert. She has normal reflexes.  confused  Skin: Skin is warm.  Psychiatric: She has a normal  mood and affect.  Nursing note and vitals reviewed.   ED Course  Procedures (including critical care time) Labs Review Labs Reviewed - No data to display  Imaging Review No results found.   EKG Interpretation None     Results for orders placed or performed during the hospital encounter of 10/14/14  CBC with Differential/Platelet  Result Value Ref Range   WBC 6.3 4.0 - 10.5 K/uL   RBC 4.84 3.87 - 5.11 MIL/uL   Hemoglobin 13.9 12.0 - 15.0 g/dL   HCT 09.8 11.9 - 14.7 %   MCV 87.0 78.0 - 100.0 fL   MCH 28.7 26.0 - 34.0 pg   MCHC 33.0 30.0 - 36.0 g/dL   RDW 82.9 56.2 - 13.0 %   Platelets 200 150 - 400 K/uL   Neutrophils Relative % 60 43 - 77 %   Neutro Abs 3.8 1.7 - 7.7 K/uL   Lymphocytes Relative 29 12 - 46 %   Lymphs Abs 1.8 0.7 - 4.0 K/uL   Monocytes Relative 8 3 - 12 %   Monocytes Absolute 0.5 0.1 - 1.0 K/uL   Eosinophils Relative 3 0 - 5 %   Eosinophils Absolute 0.2 0.0 - 0.7 K/uL   Basophils Relative 0 0 - 1 %   Basophils Absolute 0.0 0.0 - 0.1 K/uL  Comprehensive metabolic panel  Result Value Ref Range   Sodium 141 135 - 145 mmol/L   Potassium 3.8 3.5 - 5.1 mmol/L   Chloride 106 101 - 111 mmol/L   CO2 26 22 - 32 mmol/L   Glucose, Bld 96 65 - 99 mg/dL   BUN 11 6 - 20 mg/dL   Creatinine, Ser 8.65 0.44 - 1.00 mg/dL   Calcium 8.7 (L) 8.9 - 10.3 mg/dL   Total Protein 6.1 (L) 6.5 - 8.1 g/dL   Albumin 3.6 3.5 - 5.0 g/dL   AST 22 15 - 41 U/L   ALT 19 14 - 54 U/L   Alkaline Phosphatase 61 38 - 126 U/L   Total Bilirubin 0.4 0.3 - 1.2 mg/dL   GFR calc non Af Amer >60 >60 mL/min   GFR calc Af Amer >60 >60 mL/min   Anion gap 9 5 - 15  Urinalysis, Routine w reflex microscopic (not at [facility])  Result Value Ref Range   Color, Urine YELLOW YELLOW   APPearance CLOUDY (A) CLEAR   Specific Gravity, Urine 1.020 1.005 - 1.030   pH 6.5 5.0 - 8.0   Glucose, UA NEGATIVE NEGATIVE mg/dL   Hgb urine dipstick TRACE (A) NEGATIVE   Bilirubin Urine NEGATIVE NEGATIVE   Ketones, ur  NEGATIVE NEGATIVE mg/dL   Protein, ur NEGATIVE NEGATIVE mg/dL   Urobilinogen, UA 0.2 0.0 - 1.0 mg/dL   Nitrite NEGATIVE NEGATIVE   Leukocytes, UA SMALL (A) NEGATIVE  Urine microscopic-add on  Result Value Ref Range   Squamous Epithelial / LPF RARE RARE   WBC, UA TOO NUMEROUS  TO COUNT <3 WBC/hpf   RBC / HPF 0-2 <3 RBC/hpf   Bacteria, UA MANY (A) RARE   Ct Head Wo Contrast  10/14/2014   CLINICAL DATA:  Seizure with fall  EXAM: CT HEAD WITHOUT CONTRAST  CT CERVICAL SPINE WITHOUT CONTRAST  TECHNIQUE: Multidetector CT imaging of the head and cervical spine was performed following the standard protocol without intravenous contrast. Multiplanar CT image reconstructions of the cervical spine were also generated.  COMPARISON:  None.  FINDINGS: CT HEAD FINDINGS  There is mild diffuse atrophy. There is no intracranial mass, hemorrhage, extra-axial fluid collection, or midline shift. There is mild small vessel disease in the centra semiovale bilaterally. Elsewhere gray-white compartments appear normal. No acute infarct evident. The bony calvarium appears intact. The mastoid air cells are clear. There is opacification of several ethmoid air cells.  CT CERVICAL SPINE FINDINGS  There is no fracture or spondylolisthesis. Prevertebral soft tissues and predental space regions are normal. There is moderate disc space narrowing at C5-6 and C6-7. There is facet hypertrophy at multiple levels bilaterally. There is no frank disc extrusion or stenosis.  IMPRESSION: CT head: Mild atrophy with mild patchy periventricular small vessel disease. No intracranial mass, hemorrhage, or extra-axial fluid collection. No acute infarct apparent. Mild ethmoid sinus disease present bilaterally.  CT cervical spine: Osteoarthritic change at several levels. No fracture or spondylolisthesis.   Electronically Signed   By: Bretta BangWilliam  Woodruff III M.D.   On: 10/14/2014 07:06   Ct Cervical Spine Wo Contrast  10/14/2014   CLINICAL DATA:  Seizure  with fall  EXAM: CT HEAD WITHOUT CONTRAST  CT CERVICAL SPINE WITHOUT CONTRAST  TECHNIQUE: Multidetector CT imaging of the head and cervical spine was performed following the standard protocol without intravenous contrast. Multiplanar CT image reconstructions of the cervical spine were also generated.  COMPARISON:  None.  FINDINGS: CT HEAD FINDINGS  There is mild diffuse atrophy. There is no intracranial mass, hemorrhage, extra-axial fluid collection, or midline shift. There is mild small vessel disease in the centra semiovale bilaterally. Elsewhere gray-white compartments appear normal. No acute infarct evident. The bony calvarium appears intact. The mastoid air cells are clear. There is opacification of several ethmoid air cells.  CT CERVICAL SPINE FINDINGS  There is no fracture or spondylolisthesis. Prevertebral soft tissues and predental space regions are normal. There is moderate disc space narrowing at C5-6 and C6-7. There is facet hypertrophy at multiple levels bilaterally. There is no frank disc extrusion or stenosis.  IMPRESSION: CT head: Mild atrophy with mild patchy periventricular small vessel disease. No intracranial mass, hemorrhage, or extra-axial fluid collection. No acute infarct apparent. Mild ethmoid sinus disease present bilaterally.  CT cervical spine: Osteoarthritic change at several levels. No fracture or spondylolisthesis.   Electronically Signed   By: Bretta BangWilliam  Woodruff III M.D.   On: 10/14/2014 07:06    MDM  I doubt seizure.  Pt does have infection in urine.  Urine is a cath specimin.   Final diagnoses:  Fall  Neck pain  Fall, initial encounter  UTI (lower urinary tract infection)    Keflex for uti c spine and ct head normal AVS       Elson AreasLeslie K Camp Gopal, PA-C 10/14/14 [address]1047  Elzena Muston K WisackySofia, PA-C 10/14/14 1050  Blake DivineJohn Wofford, MD 10/18/14 1156

## 2014-10-14 NOTE — ED Notes (Signed)
Pt. c-collar removed by RN per Colin BroachKaren S. PA

## 2014-10-14 NOTE — Discharge Instructions (Signed)
Fall Prevention and Home Safety  Falls cause injuries and can affect all age groups. It is possible to use preventive measures to significantly decrease the likelihood of falls. There are many simple measures which can make your home safer and prevent falls.  OUTDOORS   Repair cracks and edges of walkways and driveways.   Remove high doorway thresholds.   Trim shrubbery on the main path into your home.   Have good outside lighting.   Clear walkways of tools, rocks, debris, and clutter.   Check that handrails are not broken and are securely fastened. Both sides of steps should have handrails.   Have leaves, snow, and ice cleared regularly.   Use sand or salt on walkways during winter months.   In the garage, clean up grease or oil spills.  BATHROOM   Install night lights.   Install grab bars by the toilet and in the tub and shower.   Use non-skid mats or decals in the tub or shower.   Place a plastic non-slip stool in the shower to sit on, if needed.   Keep floors dry and clean up all water on the floor immediately.   Remove soap buildup in the tub or shower on a regular basis.   Secure bath mats with non-slip, double-sided rug tape.   Remove throw rugs and tripping hazards from the floors.  BEDROOMS   Install night lights.   Make sure a bedside light is easy to reach.   Do not use oversized bedding.   Keep a telephone by your bedside.   Have a firm chair with side arms to use for getting dressed.   Remove throw rugs and tripping hazards from the floor.  KITCHEN   Keep handles on pots and pans turned toward the center of the stove. Use back burners when possible.   Clean up spills quickly and allow time for drying.   Avoid walking on wet floors.   Avoid hot utensils and knives.   Position shelves so they are not too high or low.   Place commonly used objects within easy reach.   If necessary, use a sturdy step stool with a grab bar when reaching.   Keep electrical cables out of the  way.   Do not use floor polish or wax that makes floors slippery. If you must use wax, use non-skid floor wax.   Remove throw rugs and tripping hazards from the floor.  STAIRWAYS   Never leave objects on stairs.   Place handrails on both sides of stairways and use them. Fix any loose handrails. Make sure handrails on both sides of the stairways are as long as the stairs.   Check carpeting to make sure it is firmly attached along stairs. Make repairs to worn or loose carpet promptly.   Avoid placing throw rugs at the top or bottom of stairways, or properly secure the rug with carpet tape to prevent slippage. Get rid of throw rugs, if possible.   Have an electrician put in a light switch at the top and bottom of the stairs.  OTHER FALL PREVENTION TIPS   Wear low-heel or rubber-soled shoes that are supportive and fit well. Wear closed toe shoes.   When using a stepladder, make sure it is fully opened and both spreaders are firmly locked. Do not climb a closed stepladder.   Add color or contrast paint or tape to grab bars and handrails in your home. Place contrasting color strips on first and last   steps.   Learn and use mobility aids as needed. Install an electrical emergency response system.   Turn on lights to avoid dark areas. Replace light bulbs that burn out immediately. Get light switches that glow.   Arrange furniture to create clear pathways. Keep furniture in the same place.   Firmly attach carpet with non-skid or double-sided tape.   Eliminate uneven floor surfaces.   Select a carpet pattern that does not visually hide the edge of steps.   Be aware of all pets.  OTHER HOME SAFETY TIPS   Set the water temperature for 120 F (48.8 C).   Keep emergency numbers on or near the telephone.   Keep smoke detectors on every level of the home and near sleeping areas.  Document Released: 04/26/2002 Document Revised: 11/05/2011 Document Reviewed: 07/26/2011  ExitCare Patient Information 2015  [employer]. This information is not intended to replace advice given to you by your health care provider. Make sure you discuss any questions you have with your health care provider.    Urinary Tract Infection  Urinary tract infections (UTIs) can develop anywhere along your urinary tract. Your urinary tract is your body's drainage system for removing wastes and extra water. Your urinary tract includes two kidneys, two ureters, a bladder, and a urethra. Your kidneys are a pair of bean-shaped organs. Each kidney is about the size of your fist. They are located below your ribs, one on each side of your spine.  CAUSES  Infections are caused by microbes, which are microscopic organisms, including fungi, viruses, and bacteria. These organisms are so small that they can only be seen through a microscope. Bacteria are the microbes that most commonly cause UTIs.  SYMPTOMS   Symptoms of UTIs may vary by age and gender of the patient and by the location of the infection. Symptoms in young women typically include a frequent and intense urge to urinate and a painful, burning feeling in the bladder or urethra during urination. Older women and men are more likely to be tired, shaky, and weak and have muscle aches and abdominal pain. A fever may mean the infection is in your kidneys. Other symptoms of a kidney infection include pain in your back or sides below the ribs, nausea, and vomiting.  DIAGNOSIS  To diagnose a UTI, your caregiver will ask you about your symptoms. Your caregiver also will ask to provide a urine sample. The urine sample will be tested for bacteria and white blood cells. White blood cells are made by your body to help fight infection.  TREATMENT   Typically, UTIs can be treated with medication. Because most UTIs are caused by a bacterial infection, they usually can be treated with the use of antibiotics. The choice of antibiotic and length of treatment depend on your symptoms and the type of bacteria causing  your infection.  HOME CARE INSTRUCTIONS   If you were prescribed antibiotics, take them exactly as your caregiver instructs you. Finish the medication even if you feel better after you have only taken some of the medication.   Drink enough water and fluids to keep your urine clear or pale yellow.   Avoid caffeine, tea, and carbonated beverages. They tend to irritate your bladder.   Empty your bladder often. Avoid holding urine for long periods of time.   Empty your bladder before and after sexual intercourse.   After a bowel movement, women should cleanse from front to back. Use each tissue only once.  SEEK MEDICAL CARE   IF:    You have back pain.   You develop a fever.   Your symptoms do not begin to resolve within 3 days.  SEEK IMMEDIATE MEDICAL CARE IF:    You have severe back pain or lower abdominal pain.   You develop chills.   You have nausea or vomiting.   You have continued burning or discomfort with urination.  MAKE SURE YOU:    Understand these instructions.   Will watch your condition.   Will get help right away if you are not doing well or get worse.  Document Released: 02/13/2005 Document Revised: 11/05/2011 Document Reviewed: 06/14/2011  Exit[employer] Patient Information 2015 [employer]. This information is not intended to replace advice given to you by your health care provider. Make sure you discuss any questions you have with your health care provider.

## 2014-10-14 NOTE — ED Notes (Signed)
Pt. Placed on bedpan, unable to independently void at this time.

## 2014-10-16 LAB — URINE CULTURE: SPECIAL REQUESTS: NORMAL

## 2014-10-17 ENCOUNTER — Telehealth (HOSPITAL_COMMUNITY): Payer: Self-pay

## 2014-10-17 NOTE — Telephone Encounter (Signed)
Post ED Visit - Positive Culture Follow-up  Culture report reviewed by antimicrobial stewardship pharmacist: []  Wes Dulaney, Pharm.D., BCPS []  Celedonio MiyamotoJeremy Frens, Pharm.D., BCPS []  Georgina PillionElizabeth Martin, Pharm.D., BCPS []  LapelMinh Pham, [address]Ph[address]D., BCPS, AAHIVP []  Estella HuskMichelle Turner, Pharm.D., BCPS, AAHIVP []  Elder CyphersLorie Poole, [address]Ph[address]D., BCPS X Rachel Rumbarger Pharm D  Positive urine culture Treated with Cephalexin, organism sensitive to the same and no further patient follow-up is required at this time.  Ashley JacobsFesterman, Bella Brummet C 10/17/2014, 3:37 PM

## 2014-11-22 ENCOUNTER — Telehealth: Payer: Self-pay

## 2014-11-22 NOTE — Telephone Encounter (Signed)
I called [facility] (whom I received manual fax from) at [phone], I was told I needed to call another department. I was given the number [phone], I called and was told I need to call another department. I was given the number [phone] [employer](insurance dept). After holding several min called ended due to high volume of calls/refills to complete.   I was calling [facility] as a courtesy to inform them that we will address fax once patient seen. We will await to see patient

## 2014-11-22 NOTE — Telephone Encounter (Signed)
Paper was received via manual fax:  RX for Nystatin is not covered by patient's insurance and we need to send rx for an alternative to [facility] [place] phone number [phone], fax [phone].  I called patient, spoke with son. Patient was rx'ed Nystatin cream for rash under breast. RX was never rx'ed by this office. Patient's son is not sure if rash is still present. Patient's son will follow-up with his wife and patient to see is rx is still needed. Patient is also due for 3 month follow-up. I scheduled appointment for next Thursday 12/01/14. Patient's son is going out of town and that's the earliest he can bring her in.   Patient's son was informed that because we have not rx'ed medication or seen patient for rash under breast she may need an appointment first before rx alternative to be given, patient son verbalized understanding. Final decision will come from the doctor if patient's son calls back requesting medication prior than scheduled appointment.

## 2014-12-01 ENCOUNTER — Ambulatory Visit (INDEPENDENT_AMBULATORY_CARE_PROVIDER_SITE_OTHER): Payer: Medicare Other | Admitting: Internal Medicine

## 2014-12-01 ENCOUNTER — Encounter: Payer: Self-pay | Admitting: Internal Medicine

## 2014-12-01 VITALS — BP 138/88 | HR 70 | Temp 98.3°F | Resp 20 | Ht 62.0 in | Wt 210.4 lb

## 2014-12-01 DIAGNOSIS — G300 Alzheimer's disease with early onset: Secondary | ICD-10-CM | POA: Diagnosis not present

## 2014-12-01 DIAGNOSIS — R634 Abnormal weight loss: Secondary | ICD-10-CM

## 2014-12-01 DIAGNOSIS — F0280 Dementia in other diseases classified elsewhere without behavioral disturbance: Secondary | ICD-10-CM

## 2014-12-01 DIAGNOSIS — J449 Chronic obstructive pulmonary disease, unspecified: Secondary | ICD-10-CM | POA: Diagnosis not present

## 2014-12-01 DIAGNOSIS — F32A Depression, unspecified: Secondary | ICD-10-CM

## 2014-12-01 DIAGNOSIS — F329 Major depressive disorder, single episode, unspecified: Secondary | ICD-10-CM | POA: Diagnosis not present

## 2014-12-01 DIAGNOSIS — G4700 Insomnia, unspecified: Secondary | ICD-10-CM | POA: Diagnosis not present

## 2014-12-01 DIAGNOSIS — M159 Polyosteoarthritis, unspecified: Secondary | ICD-10-CM

## 2014-12-01 NOTE — Progress Notes (Signed)
Patient ID: Sherri Moses, female   DOB: 10-Jun-1950, 64 y.o.   MRN: [id number]   Location:  [facility]  Code Status: DNR Goals of Care: Advanced Directive information Does patient have an advance directive?: Yes, Type of Advance Directive: Healthcare Power of [place];Living will;Out of facility DNR (pink MOST or yellow form), Pre-existing out of facility DNR order (yellow form or pink MOST form): Yellow form placed in chart (order not valid for inpatient use), Does patient want to make changes to advanced directive?: No - Patient declined   Chief Complaint  Patient presents with  . Medical Management of Chronic Issues    3 month follow-up, patient in a lot of pain    HPI: Patient is a 64 y.o. white female seen in the office today for medical mgt of chronic diseases.    Sunday wouldn't get out of bed due to pain. Was her back that day.  Had surgery on her back 30 years ago.  Pain in hands.   Review of MAR shows that she's been getting her tramadol twice daily and mobic daily, but has only gotten prn tylenol two doses so far this month though ordered tid prn Larey Seat in Jan and May, but no major injuries.  Did go to ED.  Had UTI in May.   Everything hurts today.  Crying when in pain.    Has been in a good mood when her son and DIL visit.  No fits or outbursts.  Depression much better after med changes.    She has lost weight--6 lbs since March--4 mos ago.  Appetite has been ok per nursing.    Review of Systems:  Review of Systems  Constitutional: Positive for weight loss. Negative for fever, chills and malaise/fatigue.  HENT: Negative for congestion.   Eyes: Negative for blurred vision.       Glasses  Respiratory: Negative for shortness of breath.   Cardiovascular: Negative for chest pain.  Gastrointestinal: Negative for abdominal pain, constipation, blood in stool and melena.  Genitourinary: Negative for dysuria.  Musculoskeletal: Positive  for back pain, joint pain and falls. Negative for myalgias.  Skin: Negative for rash.  Neurological: Negative for dizziness, loss of consciousness and weakness.  Psychiatric/Behavioral: Positive for depression and memory loss. The patient is nervous/anxious and has insomnia.     Past Medical History  Diagnosis Date  . Anxiety   . Depression   . Alzheimer disease 2008  . Insomnia   . COPD (chronic obstructive pulmonary disease) 2013    Past Surgical History  Procedure Laterality Date  . Rotator cuff repair    . Carpeal tunnel repair    . Cholecystectomy    . Appendectomy    . Abdominal hysterectomy    . Cataract extraction, bilateral  July 2015    Allergies  Allergen Reactions  . Codeine Other (See Comments)    ON MAR   Medications: Patient's Medications  New Prescriptions   No medications on file  Previous Medications   ACETAMINOPHEN (TYLENOL) 500 MG TABLET    Take 500 mg by mouth 2 (two) times daily. And daily as needed for breakthrough   DONEPEZIL (ARICEPT) 10 MG TABLET    Take 1 tablet by mouth twice daily for Alzheimer's Disease   DULOXETINE (CYMBALTA) 60 MG CAPSULE    Take 1 capsule (60 mg total) by mouth daily.   MELOXICAM (MOBIC) 7.5 MG TABLET    Take 1 tablet (7.5 mg total) by mouth  daily. Take 7.5 mg by mouth daily. Give with food if patient has a upset stomach.   MEMANTINE (NAMENDA) 10 MG TABLET    Take 1 tablet by mouth twice daily for Alzheimer's Disease   NYSTATIN CREAM (MYCOSTATIN)    Apply 1 application topically 2 (two) times daily.   TRAMADOL (ULTRAM) 50 MG TABLET    Take 1 tablet (50 mg total) by mouth 2 (two) times daily.   TRAZODONE (DESYREL) 150 MG TABLET    Take 150 mg by mouth at bedtime. 1 by mouth at bedtime for insomnia   TROLAMINE SALICYLATE (ARTHRICREAM EX)    Apply 1 application topically 4 (four) times daily as needed (to bilateral knees for arthritic pain).  Modified Medications   No medications on file  Discontinued Medications    CEPHALEXIN (KEFLEX) 500 MG CAPSULE    Take 1 capsule (500 mg total) by mouth 4 (four) times daily.    Physical Exam: Filed Vitals:   12/01/14 0757  BP: 138/88  Pulse: 70  Temp: 98.3 F (36.8 C)  TempSrc: Oral  Resp: 20  Height: 5\' 2"  (1.575 m)  Weight: 210 lb 6.4 oz (95.437 kg)  SpO2: 96%   Physical Exam  Constitutional: She appears well-developed and well-nourished. No distress.  Cardiovascular: Normal rate, regular rhythm, normal heart sounds and intact distal pulses.   Pulmonary/Chest: Effort normal and breath sounds normal. No respiratory distress.  Abdominal: Soft. Bowel sounds are normal.  Musculoskeletal: She exhibits tenderness.  Of bilateral knees, heberden's, bouchard's nodes, mcps, lumbar region  Neurological: She is alert.  More difficulty finding words, stuttering some  Skin: Skin is warm and dry.  No macerated erythematous rash beneath breasts at this time    Labs reviewed: Basic Metabolic Panel:  Recent Labs  08/65/78 0951 10/14/14 0643  NA 141 141  K 4.1 3.8  CL 107 106  CO2 26 26  GLUCOSE 99 96  BUN 11 11  CREATININE 0.68 0.76  CALCIUM 8.8 8.7*   Liver Function Tests:  Recent Labs  06/02/14 0951 10/14/14 0643  AST 17 22  ALT 11 19  ALKPHOS 58 61  BILITOT 0.3 0.4  PROT 6.7 6.1*  ALBUMIN 4.0 3.6   No results for input(s): LIPASE, AMYLASE in the last 8760 hours. No results for input(s): AMMONIA in the last 8760 hours. CBC:  Recent Labs  06/02/14 0951 10/14/14 0643  WBC 8.4 6.3  NEUTROABS 5.5 3.8  HGB 13.7 13.9  HCT 43.5 42.1  MCV 88.8 87.0  PLT 223 200   Lipid Panel: No results for input(s): CHOL, HDL, LDLCALC, TRIG, CHOLHDL, LDLDIRECT in the last 8760 hours. Lab Results  Component Value Date   HGBA1C 5.8* 07/26/2013   Assessment/Plan 1. Generalized osteoarthritis of multiple sites - pain not adequately controlled with tramadol bid, mobic daily and rarely used prn tylenol -will obtain xrays of hands due to severity of  pain causing her to cry (r/o pseudogout, RA, etc) -also schedule tylenol bid with one additional dose that can be given for breakthrough pain - DG Hand Complete Left; Future - DG Hand Complete Right; Future  2. Depression -cont cymbalta along with trazodone for sleep -spirits are better as long as she's not hurting too much  3. Alzheimer's disease, early onset -gradually progressive, cont aricept and namenda XR--could switch to namzaric if insurance permits at some point  4. Chronic obstructive pulmonary disease, unspecified COPD, unspecified chronic bronchitis type -prior diagnosis -is not on any medications for this whatsoever -stable  5. Insomnia -stable, cont trazodone at bedtime  6. Loss of weight -continues, but still eating well and her weight is still in the overweight range, is due to her dementia and wandering  Labs/tests ordered:   Orders Placed This Encounter  Procedures  . DG Hand Complete Left    Standing Status: Future     Number of Occurrences:      Standing Expiration Date: 02/01/2016    Order Specific Question:  Reason for Exam (SYMPTOM  OR DIAGNOSIS REQUIRED)    Answer:  hand pain, stiffness    Order Specific Question:  Preferred imaging location?    Answer:  [facility]  . DG Hand Complete Right    Standing Status: Future     Number of Occurrences:      Standing Expiration Date: 02/01/2016    Order Specific Question:  Reason for Exam (SYMPTOM  OR DIAGNOSIS REQUIRED)    Answer:  hand pain, stiffness    Order Specific Question:  Preferred imaging location?    Answer:  [facility]  . DNR (Do Not Resuscitate)    Order Specific Question:  In the event of cardiac or respiratory ARREST    Answer:  Do not call a "code blue"    Order Specific Question:  In the event of cardiac or respiratory ARREST    Answer:  Do not perform Intubation, CPR, defibrillation or ACLS    Order Specific Question:  In the event of cardiac or respiratory ARREST     Answer:  Use medication by any route, position, wound care, and other measures to relive pain and suffering. May use oxygen, suction and manual treatment of airway obstruction as needed for comfort.    Next appt:  3 mos   Jaeliana Lococo L. Veleda Mun, D.O. Geriatrics [facility] [address] [place], [place] [address] Cell Phone (Mon-Fri 8am-5pm):  [phone] On Call:  [phone] & follow prompts after 5pm & weekends Office Phone:  [phone] Office Fax:  [phone]

## 2014-12-12 ENCOUNTER — Other Ambulatory Visit: Payer: Self-pay | Admitting: *Deleted

## 2014-12-12 DIAGNOSIS — M19042 Primary osteoarthritis, left hand: Principal | ICD-10-CM

## 2014-12-12 DIAGNOSIS — M19041 Primary osteoarthritis, right hand: Secondary | ICD-10-CM

## 2014-12-12 MED ORDER — TRAMADOL HCL 50 MG PO TABS: ORAL_TABLET | ORAL | Status: DC

## 2014-12-12 NOTE — Telephone Encounter (Signed)
[facility] Fax. Patient is seen in office.

## 2015-01-10 ENCOUNTER — Telehealth: Payer: Self-pay | Admitting: *Deleted

## 2015-01-10 NOTE — Telephone Encounter (Signed)
Melissa with [facility] called and stated that patient was having jerking and tremors like look like seizures but unsure what is going on. They cause patient to fall. I scheduled an appointment for patient to be seen and evaluated on Thursday and Melissa called back and stated that it didn't suite son's work schedule and canceled and will call back to reschedule.

## 2015-01-12 ENCOUNTER — Ambulatory Visit: Payer: Self-pay | Admitting: Internal Medicine

## 2015-01-12 ENCOUNTER — Ambulatory Visit
Admission: RE | Admit: 2015-01-12 | Discharge: 2015-01-12 | Disposition: A | Discharge status: Home / Self Care | Payer: Medicare Other | Source: Ambulatory Visit | Attending: Internal Medicine | Admitting: Internal Medicine

## 2015-01-12 DIAGNOSIS — M19042 Primary osteoarthritis, left hand: Secondary | ICD-10-CM | POA: Diagnosis not present

## 2015-01-12 DIAGNOSIS — M159 Polyosteoarthritis, unspecified: Secondary | ICD-10-CM

## 2015-01-12 DIAGNOSIS — M25642 Stiffness of left hand, not elsewhere classified: Secondary | ICD-10-CM | POA: Diagnosis not present

## 2015-01-12 DIAGNOSIS — M25641 Stiffness of right hand, not elsewhere classified: Secondary | ICD-10-CM | POA: Diagnosis not present

## 2015-01-20 ENCOUNTER — Telehealth: Payer: Self-pay

## 2015-01-20 NOTE — Telephone Encounter (Signed)
Received call from [facility] from Clarissa, patient son call pharmacy bill - high, she has a question on two Aricept  twice daily, Cymbalta  daily, no we have  daily. She asked if we could fax copy over to [facility] [phone] so she can fax to pharmacy. Done

## 2015-01-24 ENCOUNTER — Telehealth: Payer: Self-pay | Admitting: *Deleted

## 2015-01-24 NOTE — Telephone Encounter (Signed)
Appears med list was not updated during her visit when she was brought back to the room--please update now.  I recommend she continue the  of cymbalta daily as ordered.

## 2015-01-24 NOTE — Telephone Encounter (Signed)
Received a fax from [facility] stating "Order written on 12/08/2014 to Increase Cymbalta to  by mouth every day for depression, but office note faxed on 01/20/2015 only has  by mouth every day. POA also questions dose of . Please review and clarify if we should continue with  or decrease back to ?"  On 12/07/2014 a fax order was received from [facility] stating "Resident has been crying and agitated a lot. Balance not good since last week. What do you Advise?" And you responded back: " I recommend PT eval and Tx if family agrees and Increase Cymbalta to  by mouth daily for depression."  Please Advise Shanda Bumps wants it clarified by Dr. Renato Gails)

## 2015-01-25 MED ORDER — AMBULATORY NON FORMULARY MEDICATION: Status: DC

## 2015-01-25 NOTE — Telephone Encounter (Signed)
Medication Updated in medication list and faxed this order to [facility] Fax: [phone]

## 2015-01-30 DIAGNOSIS — M25569 Pain in unspecified knee: Secondary | ICD-10-CM | POA: Diagnosis not present

## 2015-01-30 DIAGNOSIS — M25559 Pain in unspecified hip: Secondary | ICD-10-CM | POA: Diagnosis not present

## 2015-03-01 DIAGNOSIS — Z23 Encounter for immunization: Secondary | ICD-10-CM | POA: Diagnosis not present

## 2015-03-09 ENCOUNTER — Encounter: Payer: Self-pay | Admitting: Internal Medicine

## 2015-03-09 ENCOUNTER — Ambulatory Visit (INDEPENDENT_AMBULATORY_CARE_PROVIDER_SITE_OTHER): Payer: Medicare Other | Admitting: Internal Medicine

## 2015-03-09 ENCOUNTER — Telehealth: Payer: Self-pay

## 2015-03-09 VITALS — BP 120/84 | HR 73 | Temp 97.6°F | Resp 20 | Ht 62.0 in | Wt 205.4 lb

## 2015-03-09 DIAGNOSIS — J449 Chronic obstructive pulmonary disease, unspecified: Secondary | ICD-10-CM | POA: Diagnosis not present

## 2015-03-09 DIAGNOSIS — E669 Obesity, unspecified: Secondary | ICD-10-CM

## 2015-03-09 DIAGNOSIS — F0280 Dementia in other diseases classified elsewhere without behavioral disturbance: Secondary | ICD-10-CM

## 2015-03-09 DIAGNOSIS — R739 Hyperglycemia, unspecified: Secondary | ICD-10-CM | POA: Diagnosis not present

## 2015-03-09 DIAGNOSIS — L0202 Furuncle of face: Secondary | ICD-10-CM | POA: Diagnosis not present

## 2015-03-09 DIAGNOSIS — M159 Polyosteoarthritis, unspecified: Secondary | ICD-10-CM

## 2015-03-09 DIAGNOSIS — F32A Depression, unspecified: Secondary | ICD-10-CM

## 2015-03-09 DIAGNOSIS — G300 Alzheimer's disease with early onset: Secondary | ICD-10-CM | POA: Diagnosis not present

## 2015-03-09 DIAGNOSIS — F329 Major depressive disorder, single episode, unspecified: Secondary | ICD-10-CM

## 2015-03-09 MED ORDER — DOXYCYCLINE HYCLATE 100 MG PO TABS: 100.0000 mg | ORAL_TABLET | Freq: Two times a day (BID) | ORAL | Status: DC

## 2015-03-09 NOTE — Telephone Encounter (Signed)
Patients son called to find out how his mother's appointment went and what the doctor was going to do as treatment for her face, and wanted to know what labs were ordered if any

## 2015-03-09 NOTE — Progress Notes (Signed)
Patient ID: Sherri Moses, female   DOB: 1951-05-15, 64 y.o.   MRN: [id number]   Location: [facility] Provider: Gwenith Spitz. Renato Gails, D.O., C.M.D.  Code Status: DNR Goals of Care: Advanced Directive information Does patient have an advance directive?: Yes, Type of Advance Directive: Healthcare Power of Attorney, Does patient want to make changes to advanced directive?: No - Patient declined  Chief Complaint  Patient presents with  . Medical Management of Chronic Issues    spot on face since Monday is getting larger    HPI: Patient is a 64 y.o. female seen in the office today for med mgt of chronic diseases and concern about a new lesion on her face.  Has place on her face since Monday (4 days) on right side of chin that has been growing.  It is warm, tender, swollen and not getting better.  Warm compresses were tried, but not too effective b/c she was removing them early.  Concerns for patient picking at side if I I/D it.  No fevers.    Joint pains are unchanged.  Using mobic, tramadol and tylenol.  Has lost 5 more lbs.  Staff member notes she is eating well.  She remains obese.  She gets little exercise due to her severe osteoarthritis and deconditioning.  Should have had flu shot at [facility]--staff member to call back if she has.  Will need her pneumonia vaccines and tdap also at future visits.  Review of Systems:  Review of Systems  Constitutional: Positive for weight loss. Negative for fever and chills.  HENT: Positive for hearing loss.   Eyes: Negative for blurred vision.       Glasses  Respiratory: Negative for shortness of breath and wheezing.   Cardiovascular: Negative for chest pain.  Gastrointestinal: Negative for abdominal pain.  Musculoskeletal: Positive for joint pain. Negative for falls.  Neurological: Positive for weakness. Negative for dizziness.  Endo/Heme/Allergies: Bruises/bleeds easily.  Psychiatric/Behavioral: Positive for memory loss.   Dementia, aphasia    Past Medical History  Diagnosis Date  . Anxiety   . Depression   . Alzheimer disease 2008  . Insomnia   . COPD (chronic obstructive pulmonary disease) (HCC) 2013    Past Surgical History  Procedure Laterality Date  . Rotator cuff repair    . Carpeal tunnel repair    . Cholecystectomy    . Appendectomy    . Abdominal hysterectomy    . Cataract extraction, bilateral  July 2015    Allergies  Allergen Reactions  . Codeine Other (See Comments)    ON MAR      Medication List       This list is accurate as of: 03/09/15  8:55 AM.  Always use your most recent med list.               acetaminophen 500 MG tablet  Commonly known as:  TYLENOL  Take 500 mg by mouth 2 (two) times daily. And daily as needed for breakthrough     AMBULATORY NON FORMULARY MEDICATION  Cymbalta  Sig: Take one tablet by mouth once daily for depression     ARTHRICREAM EX  Apply 1 application topically 4 (four) times daily as needed (to bilateral knees for arthritic pain).     donepezil 10 MG tablet  Commonly known as:  ARICEPT  Take 1 tablet by mouth twice daily for Alzheimer's Disease     meloxicam 7.5 MG tablet  Commonly known as:  MOBIC  Take 1 tablet (7.5  mg total) by mouth daily. Take 7.5 mg by mouth daily. Give with food if patient has a upset stomach.     NAMENDA 10 MG tablet  Generic drug:  memantine  Take 1 tablet by mouth twice daily for Alzheimer's Disease     nystatin cream  Commonly known as:  MYCOSTATIN  Apply 1 application topically 2 (two) times daily.     traMADol 50 MG tablet  Commonly known as:  ULTRAM  Take one tablet by mouth twice daily for pain     traZODone 150 MG tablet  Commonly known as:  DESYREL  Take 150 mg by mouth at bedtime. 1 by mouth at bedtime for insomnia        Health Maintenance  Topic Date Due  . Hepatitis C Screening  [birth year]  . HIV Screening  09/25/1964  . TETANUS/TDAP  09/25/1968  . PAP SMEAR  09/26/1970  .  COLONOSCOPY  09/26/1999  . ZOSTAVAX  09/25/2009  . MAMMOGRAM  08/28/2012  . PNA vac Low Risk Adult (1 of 2 - PCV13) 09/26/2014  . INFLUENZA VACCINE  12/19/2014  . DEXA SCAN  Completed    Physical Exam: Filed Vitals:   03/09/15 0843  BP: 120/84  Pulse: 73  Temp: 97.6 F (36.4 C)  TempSrc: Oral  Resp: 20  Height:  (1.575 m)  Weight: 205 lb 6.4 oz (93.169 kg)  SpO2: 97%   Body mass index is 37.56 kg/(m^2). Physical Exam  Constitutional: She appears well-developed and well-nourished. No distress.  Cardiovascular: Normal rate, regular rhythm, normal heart sounds and intact distal pulses.   Pulmonary/Chest: Effort normal and breath sounds normal. No respiratory distress.  Abdominal: Soft. Bowel sounds are normal. She exhibits no distension. There is no tenderness.  Musculoskeletal: Normal range of motion.  Lymphadenopathy:    She has cervical adenopathy.  Neurological: She is alert.  aphasia  Skin:  Right jaw with nickel-sized raised erythematous warm furuncle, tender to touch  Psychiatric:  Very pleasant, giggles when spoken to    Labs reviewed: Basic Metabolic Panel:  Recent Labs  16/10/96 0951 10/14/14 0643  NA 141 141  K 4.1 3.8  CL 107 106  CO2 26 26  GLUCOSE 99 96  BUN 11 11  CREATININE 0.68 0.76  CALCIUM 8.8 8.7*   Liver Function Tests:  Recent Labs  06/02/14 0951 10/14/14 0643  AST 17 22  ALT 11 19  ALKPHOS 58 61  BILITOT 0.3 0.4  PROT 6.7 6.1*  ALBUMIN 4.0 3.6   No results for input(s): LIPASE, AMYLASE in the last 8760 hours. No results for input(s): AMMONIA in the last 8760 hours. CBC:  Recent Labs  06/02/14 0951 10/14/14 0643  WBC 8.4 6.3  NEUTROABS 5.5 3.8  HGB 13.7 13.9  HCT 43.5 42.1  MCV 88.8 87.0  PLT 223 200   Lipid Panel: No results for input(s): CHOL, HDL, LDLCALC, TRIG, CHOLHDL, LDLDIRECT in the last 8760 hours. Lab Results  Component Value Date   HGBA1C 5.8* 07/26/2013   Assessment/Plan 1. Furuncle of  chin -warm compresses to right chin for 20 min intervals 3-4 times per day - doxycycline (VIBRA-TABS) 100 MG tablet; Take 1 tablet (100 mg total) by mouth 2 (two) times daily.  Dispense: 20 tablet; Refill: 0 - Basic metabolic panel - CBC with Differential/Platelet -if not improving by next week, will plan on debridement  2. Depression -seems better right now with her trazodone -cont cymbalta  3. Alzheimer's disease, early onset -continues to  progress--?primary progressive aphasia -continue aricept and namenda--namzaric was too costly -has more difficult with gait some of which is due to her OA  4. Chronic obstructive pulmonary disease, unspecified COPD, unspecified chronic bronchitis type -stable, by history, not on meds, no exacerbations have occurred recently  5. Generalized osteoarthritis of multiple sites -cont mobic, tramadol and tylenol for pain with benefit as well as her cymbalta -notes back, hands and knees as most painful areas  6. Obesity - [facility] - would not be able to remember proper use of assistive device - Hemoglobin A1c - Basic metabolic panel - CBC with Differential/Platelet  7. Hyperglycemia - f/u glucose average - Hemoglobin A1c   Labs/tests ordered:   Orders Placed This Encounter  Procedures  . Hemoglobin A1c  . Basic metabolic panel  . CBC with Differential/Platelet   Will do flu shot if needed here   Next appt:  06/09/2015   Arrionna Serena L. Desiree Fleming, D.O. Geriatrics [facility] [address] [place], [place] [address] Cell Phone (Mon-Fri 8am-5pm):  [phone] On Call:  [phone] & follow prompts after 5pm & weekends Office Phone:  [phone] Office Fax:  [phone]

## 2015-03-10 LAB — CBC WITH DIFFERENTIAL/PLATELET
Basophils Absolute: 0 10*3/uL (ref 0.0–0.2)
Basos: 0 %
EOS (ABSOLUTE): 0.1 10*3/uL (ref 0.0–0.4)
Eos: 1 %
Hematocrit: 42.1 % (ref 34.0–46.6)
Hemoglobin: 13.3 g/dL (ref 11.1–15.9)
Immature Grans (Abs): 0 10*3/uL (ref 0.0–0.1)
Immature Granulocytes: 0 %
Lymphocytes Absolute: 1.6 10*3/uL (ref 0.7–3.1)
Lymphs: 36 %
MCH: 27.8 pg (ref 26.6–33.0)
MCHC: 31.6 g/dL (ref 31.5–35.7)
MCV: 88 fL (ref 79–97)
Monocytes Absolute: 0.3 10*3/uL (ref 0.1–0.9)
Monocytes: 6 %
Neutrophils Absolute: 2.5 10*3/uL (ref 1.4–7.0)
Neutrophils: 57 %
Platelets: 195 10*3/uL (ref 150–379)
RBC: 4.78 x10E6/uL (ref 3.77–5.28)
RDW: 14.1 % (ref 12.3–15.4)
WBC: 4.4 10*3/uL (ref 3.4–10.8)

## 2015-03-10 LAB — BASIC METABOLIC PANEL
BUN/Creatinine Ratio: 12 (ref 11–26)
BUN: 10 mg/dL (ref 8–27)
CO2: 24 mmol/L (ref 18–29)
Calcium: 9 mg/dL (ref 8.7–10.3)
Chloride: 104 mmol/L (ref 97–106)
Creatinine, Ser: 0.84 mg/dL (ref 0.57–1.00)
GFR calc Af Amer: 84 mL/min/{1.73_m2} (ref >59)
GFR calc non Af Amer: 73 mL/min/{1.73_m2} (ref >59)
Glucose: 99 mg/dL (ref 65–99)
Potassium: 4.4 mmol/L (ref 3.5–5.2)
Sodium: 143 mmol/L (ref 136–144)

## 2015-03-10 LAB — HEMOGLOBIN A1C
Est. average glucose Bld gHb Est-mCnc: 120 mg/dL
Hgb A1c MFr Bld: 5.8 % — ABNORMAL HIGH (ref 4.8–5.6)

## 2015-06-09 ENCOUNTER — Ambulatory Visit: Payer: Medicare Other | Admitting: Internal Medicine

## 2015-06-13 ENCOUNTER — Other Ambulatory Visit: Payer: Self-pay | Admitting: *Deleted

## 2015-06-13 DIAGNOSIS — M19042 Primary osteoarthritis, left hand: Principal | ICD-10-CM

## 2015-06-13 DIAGNOSIS — M19041 Primary osteoarthritis, right hand: Secondary | ICD-10-CM

## 2015-06-13 MED ORDER — TRAMADOL HCL 50 MG PO TABS: ORAL_TABLET | ORAL | Status: DC

## 2015-06-13 NOTE — Telephone Encounter (Signed)
[facility]

## 2015-06-22 ENCOUNTER — Ambulatory Visit: Payer: Medicare Other | Admitting: Internal Medicine

## 2015-07-24 ENCOUNTER — Ambulatory Visit (INDEPENDENT_AMBULATORY_CARE_PROVIDER_SITE_OTHER): Payer: Medicare Other | Admitting: Internal Medicine

## 2015-07-24 ENCOUNTER — Encounter: Payer: Self-pay | Admitting: Internal Medicine

## 2015-07-24 VITALS — BP 114/78 | HR 76 | Temp 98.6°F | Resp 12 | Ht 62.0 in | Wt 186.0 lb

## 2015-07-24 DIAGNOSIS — Z91148 Patient's other noncompliance with medication regimen for other reason: Secondary | ICD-10-CM

## 2015-07-24 DIAGNOSIS — G300 Alzheimer's disease with early onset: Secondary | ICD-10-CM

## 2015-07-24 DIAGNOSIS — R634 Abnormal weight loss: Secondary | ICD-10-CM

## 2015-07-24 DIAGNOSIS — F32A Depression, unspecified: Secondary | ICD-10-CM

## 2015-07-24 DIAGNOSIS — F329 Major depressive disorder, single episode, unspecified: Secondary | ICD-10-CM | POA: Diagnosis not present

## 2015-07-24 DIAGNOSIS — Z9114 Patient's other noncompliance with medication regimen: Secondary | ICD-10-CM | POA: Diagnosis not present

## 2015-07-24 DIAGNOSIS — G4700 Insomnia, unspecified: Secondary | ICD-10-CM

## 2015-07-24 DIAGNOSIS — Z23 Encounter for immunization: Secondary | ICD-10-CM

## 2015-07-24 DIAGNOSIS — M159 Polyosteoarthritis, unspecified: Secondary | ICD-10-CM | POA: Diagnosis not present

## 2015-07-24 DIAGNOSIS — F0280 Dementia in other diseases classified elsewhere without behavioral disturbance: Secondary | ICD-10-CM

## 2015-07-24 MED ORDER — ZOSTER VACCINE LIVE 19400 UNT/0.65ML ~~LOC~~ SOLR: 0.6500 mL | Freq: Once | SUBCUTANEOUS | Status: DC

## 2015-07-24 NOTE — Progress Notes (Signed)
Patient ID: Sherri StammerJacqueline Gimbel, female   DOB: 03/09/1951, 65 y.o.   MRN: [id number]   [facility] Provider:  Sie Formisano L. Renato Gailseed, D.O., C.M.D.  Code Status: DNR Goals of Care:  Advanced Directives 07/24/2015  Does patient have an advance directive? Yes  Type of Advance Directive Healthcare Power of Attorney  Does patient want to make changes to advanced directive? No - Patient declined  Copy of advanced directive(s) in chart? Yes     Chief Complaint  Patient presents with  . Medical Management of Chronic Issues    Follow-up on medications, here with Son.     HPI: Patient is a 65 y.o. female seen today for medical management of chronic diseases.  She has moderate to severe dementia and has been refusing her medications recently.  Many had been dose reduced at pharmacy recommendations anyway, but this was not carried out until last week.  [facility] had been encouraging her to participate in the memory care activities and she was doing very well there.  Seemed happier and more interactive.  However, when she would return to her regular unit, she was very sad and agitated at times (which seems to be her baseline).   She's not attempting to elope and it's a financial issue to move her.  It did not improve her eating.  No bad reports about mood for 2-3 mos., but sometimes family not notified.  Gets easily frustrated at night, refuses showers and is tired and more easily upset.  Does better with bathing in the afternoons.    They do not want any pain creams.  She has a tylenol prescriptions.    She was said to have refused meds 14/20.   She is unable to explain why.  Just doesn't want to take them, but she cannot speak well anymore.  They have put them in all kinds of things.    Past Medical History  Diagnosis Date  . Anxiety   . Depression   . Alzheimer disease 2008  . Insomnia   . COPD (chronic obstructive pulmonary disease) (HCC) 2013    Past Surgical History  Procedure Laterality  Date  . Rotator cuff repair    . Carpeal tunnel repair    . Cholecystectomy    . Appendectomy    . Abdominal hysterectomy    . Cataract extraction, bilateral  July 2015    Allergies  Allergen Reactions  . Codeine Other (See Comments)    ON MAR      Medication List       This list is accurate as of: 07/24/15  8:42 AM.  Always use your most recent med list.               acetaminophen 500 MG tablet  Commonly known as:  TYLENOL  Take 500 mg by mouth 2 (two) times daily. And daily as needed for breakthrough     AMBULATORY NON FORMULARY MEDICATION  Cymbalta 90mg  Sig: Take one tablet by mouth once daily for depression     ARTHRICREAM EX  Apply 1 application topically 4 (four) times daily as needed (to bilateral knees for arthritic pain).     donepezil 10 MG tablet  Commonly known as:  ARICEPT  Take 1 tablet by mouth twice daily for Alzheimer's Disease     doxycycline 100 MG tablet  Commonly known as:  VIBRA-TABS  Take 1 tablet (100 mg total) by mouth 2 (two) times daily.     meloxicam 7.5 MG tablet  Commonly known as:  MOBIC  Take 1 tablet (7.5 mg total) by mouth daily. Take 7.5 mg by mouth daily. Give with food if patient has a upset stomach.     NAMENDA 10 MG tablet  Generic drug:  memantine  Take 1 tablet by mouth twice daily for Alzheimer's Disease     nystatin cream  Commonly known as:  MYCOSTATIN  Apply 1 application topically 2 (two) times daily.     traMADol 50 MG tablet  Commonly known as:  ULTRAM  Take one tablet by mouth twice daily for pain     traZODone 150 MG tablet  Commonly known as:  DESYREL  Take 150 mg by mouth at bedtime. 1 by mouth at bedtime for insomnia     ZOSTAVAX [address] UNT/0.65ML injection  Generic drug:  zoster vaccine live (PF)  Inject 0.65 mLs into the skin once.        Review of Systems:  Review of Systems  Constitutional: Negative for fever and chills.  HENT: Negative for congestion.   Eyes: Negative for blurred vision.         Glasses  Respiratory: Negative for shortness of breath.   Cardiovascular: Negative for chest pain and leg swelling.  Gastrointestinal: Negative for constipation.       Incontinent of bowel also; had a two day period of indigestion with abdominal pain that resolved on its own  Genitourinary:       Incontinent of bladder  Musculoskeletal: Positive for back pain and joint pain. Negative for falls.       But pt denies today  Skin: Negative for rash.  Neurological: Positive for speech change.  Psychiatric/Behavioral: Positive for memory loss. Negative for depression. The patient is not nervous/anxious and does not have insomnia.     Health Maintenance  Topic Date Due  . Hepatitis C Screening  1950/10/20  . HIV Screening  09/25/1964  . TETANUS/TDAP  09/25/1968  . PAP SMEAR  09/26/1970  . COLONOSCOPY  09/26/1999  . ZOSTAVAX  09/25/2009  . MAMMOGRAM  08/28/2012  . PNA vac Low Risk Adult (1 of 2 - PCV13) 09/26/2014  . INFLUENZA VACCINE  12/19/2015  . DEXA SCAN  Completed    Physical Exam: Filed Vitals:   07/24/15 0838  BP: 114/78  Pulse: 76  Temp: 98.6 F (37 C)  TempSrc: Oral  Resp: 12  Height:  (1.575 m)  Weight: 186 lb (84.369 kg)  SpO2: 95%   Body mass index is 34.01 kg/(m^2). Physical Exam  Constitutional: She appears well-developed and well-nourished. No distress.  HENT:  Head: Normocephalic and atraumatic.  Cardiovascular: Normal rate, regular rhythm, normal heart sounds and intact distal pulses.   Pulmonary/Chest: Effort normal and breath sounds normal. No respiratory distress.  Abdominal: Soft. Bowel sounds are normal.  Musculoskeletal: She exhibits no edema or tenderness.  walking w/o assistive device  Neurological: She is alert. She exhibits normal muscle tone.  5/5 hand grips; no rigidity noted; only able to speak very few short sayings at this point  Skin: Skin is warm and dry.  Psychiatric: She has a normal mood and affect.  Very pleasant and  smiling today    Labs reviewed: Basic Metabolic Panel:  Recent Labs  60/45/40 0643 03/09/15 0916  NA 141 143  K 3.8 4.4  CL 106 104  CO2 26 24  GLUCOSE 96 99  BUN 11 10  CREATININE 0.76 0.84  CALCIUM 8.7* 9.0   Liver Function Tests:  Recent Labs  10/14/14 0643  AST 22  ALT 19  ALKPHOS 61  BILITOT 0.4  PROT 6.1*  ALBUMIN 3.6   No results for input(s): LIPASE, AMYLASE in the last 8760 hours. No results for input(s): AMMONIA in the last 8760 hours. CBC:  Recent Labs  10/14/14 0643 03/09/15 0916  WBC 6.3 4.4  NEUTROABS 3.8 2.5  HGB 13.9  --   HCT 42.1 42.1  MCV 87.0 88  PLT 200 195   Lipid Panel: No results for input(s): CHOL, HDL, LDLCALC, TRIG, CHOLHDL, LDLDIRECT in the last 8760 hours. Lab Results  Component Value Date   HGBA1C 5.8* 03/09/2015    Assessment/Plan 1. Alzheimer's disease, early onset -this is progressing; she has decreased ability to verbalize -has not been falling lately -she also is refusing medications and has become incontinent -staff feel she would do better on the memory care unit where she could participate in more activities-=-this could improve her qol -trial of going to memory care went well in terms of her enjoying activities, eating, etc; but she did not do well transitioning back and forth multiple times per day -we agreed upon tapering off aricept and then considering tapering off namenda also (but if she's already truly not been getting these, she may already be off in which case, I will just d/c them now)  2. Depression -seems this aspect has been better recently -agitation improved with depakote so family agreeable to keeping this -also on cymbalta for depression (had periods of tearfulness)--previously was on prozac -will see how she does with taper off trazodone per phamacy recommendations  3. Generalized osteoarthritis of multiple sites -cont tylenol and tramadol as discussed with Belenda Cruise and Kathlene November -it's unclear if  she is taking these however -will reach out to [facility] via [facility] when I get the record of her medication refusals--interestingly, this month's MAR shows that she is taking the medications  4. Loss of weight -down from 205 lbs in 10/16 to 186 lbs today (19lbs) -is not eating well in AL -is still obese, but will be normal weight soon if she keeps this up -not taking meds or regular food consistently--would consider supplements, but need more information about how poorly she's eating  5. Insomnia -has been on trazodone, but being tapered for gradual dose reduction purposes and in hopes she will do ok with sleep w/o it  6. Noncompliance with medications --she has not been taking her meds at times -[facility] shows she is this month so I'm perplexed by this and have a call into the DNS at [facility] to discuss and requested a report of her missed meds over the past few months  7. Need for zoster vaccination -Rx provided to obtain this vaccine at the pharmacy  8. Need for vaccination with 13-polyvalent pneumococcal conjugate vaccine - Pneumococcal conjugate vaccine 13-valent Prevnar was given today  Labs/tests ordered:  No new Next appt:  10/30/2015 for med mgt  Will write addendum when I speak with Melissa, DNS, and receive med refusal list.    Veleta Yamamoto L. Cameryn Chrisley, D.O. Geriatrics [facility] [address], [place] [address] Cell Phone (Mon-Fri 8am-5pm):  [phone] On Call:  [phone] & follow prompts after 5pm & weekends Office Phone:  [phone] Office Fax:  [phone]

## 2015-07-28 ENCOUNTER — Other Ambulatory Visit: Payer: Self-pay | Admitting: Internal Medicine

## 2015-07-28 ENCOUNTER — Telehealth: Payer: Self-pay | Admitting: *Deleted

## 2015-07-28 MED ORDER — MELOXICAM 7.5 MG PO TABS: 7.5000 mg | ORAL_TABLET | Freq: Every day | ORAL | Status: DC

## 2015-07-28 MED ORDER — DONEPEZIL HCL 5 MG PO TABS: 5.0000 mg | ORAL_TABLET | Freq: Every day | ORAL | Status: DC

## 2015-07-28 NOTE — Telephone Encounter (Signed)
Calling pt's son to discuss visit on 07/24/15 per note from Dr. Renato Gailseed ( will be scanned in)  Received and reviewed medications refusals, pt now taking meds most in coffee, decreased Aricept to 5mg  oral drops x 14 days then d/c Aricept, also recommended memory care, [facility] x4 hours per day, continuous for 4 weeks before considering move due to cost.

## 2015-07-28 NOTE — Telephone Encounter (Signed)
.  left message to have patient return my call.  

## 2015-07-28 NOTE — Telephone Encounter (Signed)
Son called back and I advised instructions per Dr. Renato Gailseed, also son wanted to know if any more medications would be stopped especially the Zoloft, I read son note from OV on 07/24/15  " Insomnia -has been on trazodone, but being tapered for gradual dose reduction purposes and in hopes she will do ok with sleep w/o it"

## 2015-09-18 ENCOUNTER — Other Ambulatory Visit: Payer: Self-pay | Admitting: *Deleted

## 2015-09-18 MED ORDER — TRAMADOL HCL 50 MG PO TABS: 50.0000 mg | ORAL_TABLET | Freq: Four times a day (QID) | ORAL | Status: DC | PRN

## 2015-09-18 NOTE — Telephone Encounter (Signed)
[facility] fax request

## 2015-10-09 ENCOUNTER — Telehealth: Payer: Self-pay | Admitting: *Deleted
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
## 2015-10-10 ENCOUNTER — Telehealth: Payer: Self-pay

## 2015-10-10 DIAGNOSIS — F69 Unspecified disorder of adult personality and behavior: Secondary | ICD-10-CM

## 2015-10-10 NOTE — Telephone Encounter (Signed)
Message left on triage voicemail: Efraim KaufmannMelissa had a conversation with patient's son and he is not in agreement with his mother being transferred to memory care. Efraim KaufmannMelissa is at a stand still with recommendations for the patient at this time. Patient's son would like for Dr.Reed to consider checking a urinalysis or changing medications.  Pending OV 10-30-15  Please review and advise

## 2015-10-10 NOTE — Telephone Encounter (Signed)
Certainly sounds reasonable.  I know we recommended she move several months ago.

## 2015-10-10 NOTE — Telephone Encounter (Signed)
I called [facility] and left a message with the receptionist informing her of Dr.Reed's response to share with [facility]

## 2015-10-11 MED ORDER — UNABLE TO FIND: Status: DC
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
## 2015-10-11 NOTE — Telephone Encounter (Signed)
I would like to see her on a day where we have labs so that I can evaluate her and do labs.  UA c+s can be done at facility.

## 2015-10-11 NOTE — Telephone Encounter (Signed)
Order faxed to [phone], I left message for Melissa to return call when available. Reason for call- inform her of Dr.Reed's response.

## 2015-10-12 DIAGNOSIS — Z79899 Other long term (current) drug therapy: Secondary | ICD-10-CM | POA: Diagnosis not present

## 2015-10-12 DIAGNOSIS — R319 Hematuria, unspecified: Secondary | ICD-10-CM | POA: Diagnosis not present

## 2015-10-13 ENCOUNTER — Telehealth: Payer: Self-pay | Admitting: *Deleted

## 2015-10-13 MED ORDER — QUETIAPINE FUMARATE 25 MG PO TABS: ORAL_TABLET | ORAL | Status: DC
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
## 2015-10-13 NOTE — Telephone Encounter (Signed)
Spoke with Melissa and faxed to # [phone]. Medication list updated.

## 2015-10-13 NOTE — Telephone Encounter (Signed)
Delice LeschMelissa Hairston, DON with [facility] called and left message on voicemail and stated that patient had some pretty expressive behavior yesterday evening and they had to call Sherri Portelaebbie Green, NP to get one time order for Depakote. Family wants a standing order for PRN Depakote but the nurse asked if it could be changed from Depakote twice daily to three times daily. Nurse stated that her behavior gets worse in the afternoon. Wants a return call to discuss the incident in detail.   I called back and left message for Melissa to return call.

## 2015-10-13 NOTE — Telephone Encounter (Signed)
D/c namenda. Begin seroquel 25mg  po bid for agitation.

## 2015-10-19 ENCOUNTER — Encounter (HOSPITAL_COMMUNITY): Payer: Self-pay | Admitting: Emergency Medicine

## 2015-10-19 ENCOUNTER — Emergency Department (HOSPITAL_COMMUNITY)
Admission: EM | Admit: 2015-10-19 | Discharge: 2015-10-19 | Disposition: A | Discharge status: Skilled Nursing Facility | Payer: Medicare Other | Attending: Emergency Medicine | Admitting: Emergency Medicine

## 2015-10-19 DIAGNOSIS — Y939 Activity, unspecified: Secondary | ICD-10-CM | POA: Diagnosis not present

## 2015-10-19 DIAGNOSIS — W01198A Fall on same level from slipping, tripping and stumbling with subsequent striking against other object, initial encounter: Secondary | ICD-10-CM | POA: Insufficient documentation

## 2015-10-19 DIAGNOSIS — G309 Alzheimer's disease, unspecified: Secondary | ICD-10-CM | POA: Diagnosis not present

## 2015-10-19 DIAGNOSIS — J449 Chronic obstructive pulmonary disease, unspecified: Secondary | ICD-10-CM | POA: Diagnosis not present

## 2015-10-19 DIAGNOSIS — Y999 Unspecified external cause status: Secondary | ICD-10-CM | POA: Diagnosis not present

## 2015-10-19 DIAGNOSIS — F329 Major depressive disorder, single episode, unspecified: Secondary | ICD-10-CM | POA: Insufficient documentation

## 2015-10-19 DIAGNOSIS — W19XXXA Unspecified fall, initial encounter: Secondary | ICD-10-CM

## 2015-10-19 DIAGNOSIS — Y929 Unspecified place or not applicable: Secondary | ICD-10-CM | POA: Insufficient documentation

## 2015-10-19 DIAGNOSIS — Z043 Encounter for examination and observation following other accident: Secondary | ICD-10-CM | POA: Insufficient documentation

## 2015-10-19 DIAGNOSIS — Z87891 Personal history of nicotine dependence: Secondary | ICD-10-CM | POA: Diagnosis not present

## 2015-10-19 DIAGNOSIS — S0990XA Unspecified injury of head, initial encounter: Secondary | ICD-10-CM | POA: Diagnosis not present

## 2015-10-19 DIAGNOSIS — F0390 Unspecified dementia without behavioral disturbance: Secondary | ICD-10-CM | POA: Diagnosis not present

## 2015-10-19 DIAGNOSIS — T148 Other injury of unspecified body region: Secondary | ICD-10-CM | POA: Diagnosis not present

## 2015-10-19 MED ORDER — QUETIAPINE FUMARATE 25 MG PO TABS: 25.0000 mg | ORAL_TABLET | Freq: Two times a day (BID) | ORAL | Status: DC

## 2015-10-19 NOTE — ED Notes (Signed)
MD at bedside. EDP DAN PRESENT. Aware of pt current status. HX of advanced dementia and Alzheimer.  Pt walking halls. Pt made aware of discharge. Security present. Charge GafferAlaina RN made aware. All attempts made to keep pt safe.

## 2015-10-19 NOTE — ED Notes (Signed)
Eartha InchKristen Grabill POA called updated on pt current status. Aware of discharge.

## 2015-10-19 NOTE — ED Notes (Addendum)
[facility] CALLED REPORT GIVEN. Spoke with Dorinda Hillonald. Aware of pt current status. Discharge Report given

## 2015-10-19 NOTE — ED Provider Notes (Signed)
CSN: [id number]     Arrival date & time 10/19/15  0703 History   First MD Initiated Contact with Patient 10/19/15 [phone]     Chief Complaint  Patient presents with  . Fall     (Consider location/radiation/quality/duration/timing/severity/associated sxs/prior Treatment) Patient is a 65 y.o. female presenting with fall. The history is provided by the nursing home and the patient.  Fall This is a new problem. The current episode started 1 to 2 hours ago. The problem occurs rarely. The problem has not changed since onset.Pertinent negatives include no chest pain, no abdominal pain, no headaches and no shortness of breath. Nothing aggravates the symptoms. Nothing relieves the symptoms. She has tried nothing for the symptoms. The treatment provided no relief.   65 yo F with a cc of a fall.  Per nursing home slipped and bumped her head against the wall.  Did not go to the ground.  Patient denies any injury, denies pain of any kind.  Denies loc.  Denies chest pain neck pain back pain abdominal pain.  Past Medical History  Diagnosis Date  . Anxiety   . Depression   . Alzheimer disease 2008  . Insomnia   . COPD (chronic obstructive pulmonary disease) (HCC) 2013   Past Surgical History  Procedure Laterality Date  . Rotator cuff repair    . Carpeal tunnel repair    . Cholecystectomy    . Appendectomy    . Abdominal hysterectomy    . Cataract extraction, bilateral  July 2015   Family History  Problem Relation Age of Onset  . Leukemia Mother   . Stroke Father    Social History  Substance Use Topics  . Smoking status: Former Smoker -- 40 years    Quit date: 03/20/2013  . Smokeless tobacco: Never Used  . Alcohol Use: No   OB History    No data available     Review of Systems  Constitutional: Negative for fever and chills.  HENT: Negative for congestion and rhinorrhea.   Eyes: Negative for redness and visual disturbance.  Respiratory: Negative for shortness of breath and wheezing.    Cardiovascular: Negative for chest pain and palpitations.  Gastrointestinal: Negative for nausea, vomiting and abdominal pain.  Genitourinary: Negative for dysuria and urgency.  Musculoskeletal: Negative for myalgias and arthralgias.  Skin: Negative for pallor and wound.  Neurological: Negative for dizziness and headaches.      Allergies  Codeine  Home Medications   Prior to Admission medications   Medication Sig Start Date End Date Taking? Authorizing Provider  acetaminophen (TYLENOL) 500 MG tablet Take 500 mg by mouth 2 (two) times daily. And daily as needed for breakthrough    Historical Provider, MD  divalproex (DEPAKOTE SPRINKLE) 125 MG capsule Take 250 mg by mouth 2 (two) times daily.    Historical Provider, MD  DULoxetine (CYMBALTA) 30 MG capsule 90 mg.     Historical Provider, MD  meloxicam (MOBIC) 7.5 MG tablet Take 1 tablet (7.5 mg total) by mouth daily. Take 7.5 mg by mouth daily. Give with food if patient has a upset stomach. 07/28/15   Tiffany L Reed, DO  QUEtiapine (SEROQUEL) 25 MG tablet Take one tablet by mouth twice daily for agitation 10/13/15   Tiffany L Reed, DO  traMADol (ULTRAM) 50 MG tablet Take 1 tablet (50 mg total) by mouth every 6 (six) hours as needed. 09/18/15   Tiffany L Reed, DO  traZODone (DESYREL) 150 MG tablet Take 150 mg by mouth at  bedtime. 1 by mouth at bedtime for insomnia 06/23/14   Historical Provider, MD  UNABLE TO FIND Urinalysis for culture and sensitivity 10/11/15   Tiffany L Reed, DO  zoster vaccine live, PF, (ZOSTAVAX) 86578 UNT/0.65ML injection Inject 19,400 Units into the skin once. 07/24/15   Tiffany L Reed, DO   BP 136/83 mmHg  Pulse 68  Temp(Src) 97.8 F (36.6 C) (Oral)  Resp 16  SpO2 98% Physical Exam  Constitutional: She is oriented to person, place, and time. She appears well-developed and well-nourished. No distress.  HENT:  Head: Normocephalic and atraumatic.  No signs of trauma to the head  Eyes: EOM are normal. Pupils are equal,  round, and reactive to light.  Neck: Normal range of motion. Neck supple.  Cardiovascular: Normal rate and regular rhythm.  Exam reveals no gallop and no friction rub.   No murmur heard. Pulmonary/Chest: Effort normal. She has no wheezes. She has no rales.  Abdominal: Soft. She exhibits no distension. There is no tenderness. There is no rebound and no guarding.  Musculoskeletal: She exhibits no edema or tenderness.  Neurological: She is alert and oriented to person, place, and time.  Skin: Skin is warm and dry. She is not diaphoretic.  Psychiatric: She has a normal mood and affect. Her behavior is normal.  Nursing note and vitals reviewed.   ED Course  Procedures (including critical care time) Labs Review Labs Reviewed - No data to display  Imaging Review No results found. I have personally reviewed and evaluated these images and lab results as part of my medical decision-making.   EKG Interpretation None      MDM   Final diagnoses:  Fall, initial encounter    65 yo F with a chief complaints of a fall. Patient is adamant that she did not fall only leaned up against the wall. As consistent with the story from the nursing home. Patient with no signs of trauma offered CT the head and patient refusing. We'll have her follow-up with her family physician.  7:23 AM:  I have discussed the diagnosis/risks/treatment options with the patient and family and believe the pt to be eligible for discharge home to follow-up with PCP. We also discussed returning to the ED immediately if new or worsening sx occur. We discussed the sx which are most concerning (e.g., sudden worsening pain, fever, inability to tolerate by mouth) that necessitate immediate return. Medications administered to the patient during their visit and any new prescriptions provided to the patient are listed below.  Medications given during this visit Medications - No data to display  New Prescriptions   No medications on  file    The patient appears reasonably screen and/or stabilized for discharge and I doubt any other medical condition or other [facility] requiring further screening, evaluation, or treatment in the ED at this time prior to discharge.      Melene Plan, DO 10/19/15 [phone]

## 2015-10-19 NOTE — ED Notes (Signed)
Bed: [id number] Expected date:  Expected time:  Means of arrival:  Comments: EMS 65 yo female with hx dementia/fall at home
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
## 2015-10-19 NOTE — Discharge Instructions (Signed)
Fall Prevention in the Home  Falls can cause injuries and can affect people from all age groups. There are many simple things that you can do to make your home safe and to help prevent falls. WHAT CAN I DO ON THE OUTSIDE OF MY HOME?  Regularly repair the edges of walkways and driveways and fix any cracks.  Remove high doorway thresholds.  Trim any shrubbery on the main path into your home.  Use bright outdoor lighting.  Clear walkways of debris and clutter, including tools and rocks.  Regularly check that handrails are securely fastened and in good repair. Both sides of any steps should have handrails.  Install guardrails along the edges of any raised decks or porches.  Have leaves, snow, and ice cleared regularly.  Use sand or salt on walkways during winter months.  In the garage, clean up any spills right away, including grease or oil spills. WHAT CAN I DO IN THE BATHROOM?  Use night lights.  Install grab bars by the toilet and in the tub and shower. Do not use towel bars as grab bars.  Use non-skid mats or decals on the floor of the tub or shower.  If you need to sit down while you are in the shower, use a plastic, non-slip stool..  Keep the floor dry. Immediately clean up any water that spills on the floor.  Remove soap buildup in the tub or shower on a regular basis.  Attach bath mats securely with double-sided non-slip rug tape.  Remove throw rugs and other tripping hazards from the floor. WHAT CAN I DO IN THE BEDROOM?  Use night lights.  Make sure that a bedside light is easy to reach.  Do not use oversized bedding that drapes onto the floor.  Have a firm chair that has side arms to use for getting dressed.  Remove throw rugs and other tripping hazards from the floor. WHAT CAN I DO IN THE KITCHEN?   Clean up any spills right away.  Avoid walking on wet floors.  Place frequently used items in easy-to-reach places.  If you need to reach for something  above you, use a sturdy step stool that has a grab bar.  Keep electrical cables out of the way.  Do not use floor polish or wax that makes floors slippery. If you have to use wax, make sure that it is non-skid floor wax.  Remove throw rugs and other tripping hazards from the floor. WHAT CAN I DO IN THE STAIRWAYS?  Do not leave any items on the stairs.  Make sure that there are handrails on both sides of the stairs. Fix handrails that are broken or loose. Make sure that handrails are as long as the stairways.  Check any carpeting to make sure that it is firmly attached to the stairs. Fix any carpet that is loose or worn.  Avoid having throw rugs at the top or bottom of stairways, or secure the rugs with carpet tape to prevent them from moving.  Make sure that you have a light switch at the top of the stairs and the bottom of the stairs. If you do not have them, have them installed. WHAT ARE SOME OTHER FALL PREVENTION TIPS?  Wear closed-toe shoes that fit well and support your feet. Wear shoes that have rubber soles or low heels.  When you use a stepladder, make sure that it is completely opened and that the sides are firmly locked. Have someone hold the ladder while you   are using it. Do not climb a closed stepladder.  Add color or contrast paint or tape to grab bars and handrails in your home. Place contrasting color strips on the first and last steps.  Use mobility aids as needed, such as canes, walkers, scooters, and crutches.  Turn on lights if it is dark. Replace any light bulbs that burn out.  Set up furniture so that there are clear paths. Keep the furniture in the same spot.  Fix any uneven floor surfaces.  Choose a carpet design that does not hide the edge of steps of a stairway.  Be aware of any and all pets.  Review your medicines with your healthcare provider. Some medicines can cause dizziness or changes in blood pressure, which increase your risk of falling. Talk  with your health care provider about other ways that you can decrease your risk of falls. This may include working with a physical therapist or trainer to improve your strength, balance, and endurance.   This information is not intended to replace advice given to you by your health care provider. Make sure you discuss any questions you have with your health care provider.   Document Released: 04/26/2002 Document Revised: 09/20/2014 Document Reviewed: 06/10/2014 Elsevier Interactive Patient Education 2016 Elsevier Inc.  

## 2015-10-19 NOTE — ED Notes (Signed)
[facility] present. Pt needing increased encouragement to go with [facility]. Family called and made aware of pt current status. Family requested to call [facility] about transportation service. Called and placed on hold. Charge Alaina RN assisted pt to stretcher. Pt passive with event. Charge Alaina RN made decision best practice to have pt transferred at this time. Family notified. [facility] made aware of transfer.

## 2015-10-26 DIAGNOSIS — F0281 Dementia in other diseases classified elsewhere with behavioral disturbance: Secondary | ICD-10-CM | POA: Diagnosis not present

## 2015-10-26 DIAGNOSIS — J449 Chronic obstructive pulmonary disease, unspecified: Secondary | ICD-10-CM | POA: Diagnosis not present

## 2015-10-26 DIAGNOSIS — F419 Anxiety disorder, unspecified: Secondary | ICD-10-CM | POA: Diagnosis not present

## 2015-10-26 DIAGNOSIS — M159 Polyosteoarthritis, unspecified: Secondary | ICD-10-CM | POA: Diagnosis not present

## 2015-10-26 DIAGNOSIS — F329 Major depressive disorder, single episode, unspecified: Secondary | ICD-10-CM | POA: Diagnosis not present

## 2015-10-26 DIAGNOSIS — G300 Alzheimer's disease with early onset: Secondary | ICD-10-CM | POA: Diagnosis not present

## 2015-10-30 ENCOUNTER — Ambulatory Visit: Payer: Medicare Other | Admitting: Internal Medicine

## 2015-10-31 DIAGNOSIS — J449 Chronic obstructive pulmonary disease, unspecified: Secondary | ICD-10-CM | POA: Diagnosis not present

## 2015-10-31 DIAGNOSIS — F0281 Dementia in other diseases classified elsewhere with behavioral disturbance: Secondary | ICD-10-CM | POA: Diagnosis not present

## 2015-10-31 DIAGNOSIS — G300 Alzheimer's disease with early onset: Secondary | ICD-10-CM | POA: Diagnosis not present

## 2015-10-31 DIAGNOSIS — F329 Major depressive disorder, single episode, unspecified: Secondary | ICD-10-CM | POA: Diagnosis not present

## 2015-10-31 DIAGNOSIS — M159 Polyosteoarthritis, unspecified: Secondary | ICD-10-CM | POA: Diagnosis not present

## 2015-10-31 DIAGNOSIS — F419 Anxiety disorder, unspecified: Secondary | ICD-10-CM | POA: Diagnosis not present

## 2015-11-02 ENCOUNTER — Encounter: Payer: Self-pay | Admitting: Internal Medicine

## 2015-11-02 ENCOUNTER — Ambulatory Visit (INDEPENDENT_AMBULATORY_CARE_PROVIDER_SITE_OTHER): Payer: Medicare Other | Admitting: Internal Medicine

## 2015-11-02 VITALS — BP 128/60 | HR 73 | Temp 98.6°F | Ht 62.0 in | Wt 179.0 lb

## 2015-11-02 DIAGNOSIS — F0391 Unspecified dementia with behavioral disturbance: Secondary | ICD-10-CM | POA: Diagnosis not present

## 2015-11-02 DIAGNOSIS — G300 Alzheimer's disease with early onset: Secondary | ICD-10-CM | POA: Diagnosis not present

## 2015-11-02 DIAGNOSIS — F329 Major depressive disorder, single episode, unspecified: Secondary | ICD-10-CM | POA: Diagnosis not present

## 2015-11-02 DIAGNOSIS — F419 Anxiety disorder, unspecified: Secondary | ICD-10-CM

## 2015-11-02 DIAGNOSIS — M159 Polyosteoarthritis, unspecified: Secondary | ICD-10-CM

## 2015-11-02 DIAGNOSIS — F0280 Dementia in other diseases classified elsewhere without behavioral disturbance: Secondary | ICD-10-CM

## 2015-11-02 DIAGNOSIS — F03918 Unspecified dementia, unspecified severity, with other behavioral disturbance: Secondary | ICD-10-CM

## 2015-11-02 DIAGNOSIS — F32A Depression, unspecified: Secondary | ICD-10-CM
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
## 2015-11-03 ENCOUNTER — Other Ambulatory Visit: Payer: Self-pay | Admitting: *Deleted

## 2015-11-03 MED ORDER — AMBULATORY NON FORMULARY MEDICATION: Status: DC

## 2015-11-03 NOTE — Telephone Encounter (Signed)
[facility] Fax requesting hard script

## 2015-11-06 DIAGNOSIS — G300 Alzheimer's disease with early onset: Secondary | ICD-10-CM | POA: Diagnosis not present

## 2015-11-06 DIAGNOSIS — M159 Polyosteoarthritis, unspecified: Secondary | ICD-10-CM | POA: Diagnosis not present

## 2015-11-06 DIAGNOSIS — F0281 Dementia in other diseases classified elsewhere with behavioral disturbance: Secondary | ICD-10-CM | POA: Diagnosis not present

## 2015-11-06 DIAGNOSIS — F329 Major depressive disorder, single episode, unspecified: Secondary | ICD-10-CM | POA: Diagnosis not present

## 2015-11-06 DIAGNOSIS — J449 Chronic obstructive pulmonary disease, unspecified: Secondary | ICD-10-CM | POA: Diagnosis not present

## 2015-11-06 DIAGNOSIS — F419 Anxiety disorder, unspecified: Secondary | ICD-10-CM | POA: Diagnosis not present

## 2015-11-09 DIAGNOSIS — F0281 Dementia in other diseases classified elsewhere with behavioral disturbance: Secondary | ICD-10-CM | POA: Diagnosis not present

## 2015-11-09 DIAGNOSIS — M159 Polyosteoarthritis, unspecified: Secondary | ICD-10-CM | POA: Diagnosis not present

## 2015-11-09 DIAGNOSIS — F419 Anxiety disorder, unspecified: Secondary | ICD-10-CM | POA: Diagnosis not present

## 2015-11-09 DIAGNOSIS — G300 Alzheimer's disease with early onset: Secondary | ICD-10-CM | POA: Diagnosis not present

## 2015-11-09 DIAGNOSIS — F329 Major depressive disorder, single episode, unspecified: Secondary | ICD-10-CM | POA: Diagnosis not present

## 2015-11-09 DIAGNOSIS — J449 Chronic obstructive pulmonary disease, unspecified: Secondary | ICD-10-CM | POA: Diagnosis not present

## 2015-11-15 DIAGNOSIS — F419 Anxiety disorder, unspecified: Secondary | ICD-10-CM | POA: Diagnosis not present

## 2015-11-15 DIAGNOSIS — G300 Alzheimer's disease with early onset: Secondary | ICD-10-CM | POA: Diagnosis not present

## 2015-11-15 DIAGNOSIS — F0281 Dementia in other diseases classified elsewhere with behavioral disturbance: Secondary | ICD-10-CM | POA: Diagnosis not present

## 2015-11-15 DIAGNOSIS — F329 Major depressive disorder, single episode, unspecified: Secondary | ICD-10-CM | POA: Diagnosis not present

## 2015-11-15 DIAGNOSIS — M159 Polyosteoarthritis, unspecified: Secondary | ICD-10-CM | POA: Diagnosis not present

## 2015-11-15 DIAGNOSIS — J449 Chronic obstructive pulmonary disease, unspecified: Secondary | ICD-10-CM | POA: Diagnosis not present

## 2015-11-23 ENCOUNTER — Encounter: Payer: Self-pay | Admitting: Internal Medicine

## 2015-11-23 ENCOUNTER — Ambulatory Visit (INDEPENDENT_AMBULATORY_CARE_PROVIDER_SITE_OTHER): Payer: Medicare Other | Admitting: Internal Medicine

## 2015-11-23 VITALS — BP 110/68 | HR 66 | Temp 97.5°F | Wt 174.0 lb

## 2015-11-23 DIAGNOSIS — F329 Major depressive disorder, single episode, unspecified: Secondary | ICD-10-CM

## 2015-11-23 DIAGNOSIS — G300 Alzheimer's disease with early onset: Secondary | ICD-10-CM

## 2015-11-23 DIAGNOSIS — J449 Chronic obstructive pulmonary disease, unspecified: Secondary | ICD-10-CM | POA: Diagnosis not present

## 2015-11-23 DIAGNOSIS — F0391 Unspecified dementia with behavioral disturbance: Secondary | ICD-10-CM

## 2015-11-23 DIAGNOSIS — R634 Abnormal weight loss: Secondary | ICD-10-CM | POA: Insufficient documentation

## 2015-11-23 DIAGNOSIS — F419 Anxiety disorder, unspecified: Secondary | ICD-10-CM | POA: Insufficient documentation

## 2015-11-23 DIAGNOSIS — M159 Polyosteoarthritis, unspecified: Secondary | ICD-10-CM

## 2015-11-23 DIAGNOSIS — F0281 Dementia in other diseases classified elsewhere with behavioral disturbance: Secondary | ICD-10-CM | POA: Diagnosis not present

## 2015-11-23 DIAGNOSIS — F03918 Unspecified dementia, unspecified severity, with other behavioral disturbance: Secondary | ICD-10-CM

## 2015-11-23 DIAGNOSIS — F32A Depression, unspecified: Secondary | ICD-10-CM

## 2015-11-23 DIAGNOSIS — F0280 Dementia in other diseases classified elsewhere without behavioral disturbance: Secondary | ICD-10-CM

## 2015-11-23 NOTE — Progress Notes (Signed)
Location:  [facility] Provider:  Carmyn Hamm L. Renato Gailseed, D.O., C.M.D.  Code Status: DNR Goals of Care:  Advanced Directives 11/23/2015  Does patient have an advance directive? Yes  Type of Advance Directive Healthcare Power of Attorney  Does patient want to make changes to advanced directive? -  Copy of advanced directive(s) in chart? Yes     Chief Complaint  Patient presents with  . Medical Management of Chronic Issues    1 mth follow-up    HPI: Patient is a 65 y.o. female seen today for medical management of chronic diseases.    Since prescribed, ativan gel has been used 8x.  Psych notes indicate no episodes of agitation.  Nursing has documented an episode where Annice PihJackie was arguing with the receptionist when people were coming and going from the building.  She told the receptionist to shut up when she was told she could not leave the premises.   She only had one episode of pain where she would not stand up in the bistro due to knee pain.  She has not received any tramadol this month.    She has been tapered off of the cymbalta.  She's had no crying spells.    Per Belenda CruiseKristin, she and Kathlene NovemberMike have not received any calls from psych nursing in the past month and no changes have been recommended or made by them.  I have heard nothing from them either.  She feels there is no benefit to their services at this time especially since they seemed to think that Annice PihJackie could sign a consent for herself.  Past Medical History  Diagnosis Date  . Anxiety   . Depression   . Alzheimer disease 2008  . Insomnia   . COPD (chronic obstructive pulmonary disease) (HCC) 2013    Past Surgical History  Procedure Laterality Date  . Rotator cuff repair    . Carpeal tunnel repair    . Cholecystectomy    . Appendectomy    . Abdominal hysterectomy    . Cataract extraction, bilateral  July 2015    Allergies  Allergen Reactions  . Codeine Other (See Comments)    ON MAR      Medication List       This  list is accurate as of: 11/23/15  7:53 AM.  Always use your most recent med list.               acetaminophen 500 MG tablet  Commonly known as:  TYLENOL  Take 500 mg by mouth 2 (two) times daily. And daily as needed for breakthrough     AMBULATORY NON FORMULARY MEDICATION  Ativan Gel 0.5mg  Sig: Topically to neck or arms every 6 hours as needed for agitation/anxiety     divalproex 125 MG capsule  Commonly known as:  DEPAKOTE SPRINKLE  Take 250 mg by mouth 2 (two) times daily.     DULoxetine 30 MG capsule  Commonly known as:  CYMBALTA  Take 2 tablets by mouth for 1 week, then take 1 tablet by mouth daily for 1 week then discontinue.     LORazepam 2 MG/ML concentrated solution  Commonly known as:  ATIVAN  Apply 0.5 mg topically every 6 (six) hours as needed for anxiety (agitation).     meloxicam 15 MG tablet  Commonly known as:  MOBIC  Take 15 mg by mouth daily.     QUEtiapine 25 MG tablet  Commonly known as:  SEROQUEL  Take one tablet by mouth twice daily  for agitation     traMADol 50 MG tablet  Commonly known as:  ULTRAM  Take 1 tablet (50 mg total) by mouth every 6 (six) hours as needed.       Review of Systems:  Review of Systems  Constitutional: Negative for fever, chills and malaise/fatigue.  HENT: Positive for hearing loss.   Eyes: Negative for blurred vision.       Glasses  Respiratory: Negative for shortness of breath.   Cardiovascular: Negative for chest pain, palpitations and leg swelling.  Gastrointestinal: Negative for abdominal pain, constipation, blood in stool and melena.  Musculoskeletal: Negative for back pain, joint pain and falls.  Skin: Negative for rash.  Neurological: Negative for dizziness, loss of consciousness and weakness.  Psychiatric/Behavioral: Positive for hallucinations and memory loss. Negative for depression. The patient is nervous/anxious. The patient does not have insomnia.     Health Maintenance  Topic Date Due  . Hepatitis C  Screening  1951/02/23  . ZOSTAVAX  09/25/2009  . INFLUENZA VACCINE  12/19/2015  . PNA vac Low Risk Adult (2 of 2 - PPSV23) 07/23/2016  . DEXA SCAN  Completed    Physical Exam: Filed Vitals:   11/23/15 0737  BP: 110/68  Pulse: 66  Temp: 97.5 F (36.4 C)  TempSrc: Oral  Weight: 174 lb (78.926 kg)   Body mass index is 31.82 kg/(m^2). Physical Exam  Constitutional: She appears well-developed and well-nourished. No distress.  Cardiovascular: Normal rate, regular rhythm, normal heart sounds and intact distal pulses.   Pulmonary/Chest: Effort normal and breath sounds normal. No respiratory distress.  Abdominal: Soft. Bowel sounds are normal.  Musculoskeletal: Normal range of motion.  Neurological: She is alert.  Minimal speech, very pleasant and calm here  Skin: Skin is warm and dry.  Psychiatric: She has a normal mood and affect.    Labs reviewed: Basic Metabolic Panel:  Recent Labs  16/02/9609/20/16 0916  NA 143  K 4.4  CL 104  CO2 24  GLUCOSE 99  BUN 10  CREATININE 0.84  CALCIUM 9.0   Liver Function Tests: No results for input(s): AST, ALT, ALKPHOS, BILITOT, PROT, ALBUMIN in the last 8760 hours. No results for input(s): LIPASE, AMYLASE in the last 8760 hours. No results for input(s): AMMONIA in the last 8760 hours. CBC:  Recent Labs  03/09/15 0916  WBC 4.4  NEUTROABS 2.5  HCT 42.1  MCV 88  PLT 195   Lipid Panel: No results for input(s): CHOL, HDL, LDLCALC, TRIG, CHOLHDL, LDLDIRECT in the last 8760 hours. Lab Results  Component Value Date   HGBA1C 5.8* 03/09/2015    Assessment/Plan 1. Alzheimer's disease, early onset -with behaviors -has progressed and now only a few understandable words  2. Dementia with aggressive behavior -cont ativan gel for agitation--must be legit agitation, verbal or physical abusive behavior, and seroquel for hallucinations -d/c psych nursing due to no changes, no communication here anyway  3. Depression -off cymbalta and doing  fine  4. Anxiety -cont ativan gel as needed for agitation spells -not used daily  5. Generalized osteoarthritis of multiple sites -cont tylenol and prn tramadol--if tramadol not used in next month, consider d/c  6. Loss of weight -over past month -had been trending down since 3/16  Labs/tests ordered:  No orders of the defined types were placed in this encounter.   Next appt:  01/25/2016   Terrionna Bridwell L. Jazzmine Kleiman, D.O. Geriatrics [facility] [address] [place], [place] [address] Cell Phone (  Mon-Fri 8am-5pm):  [phone] On Call:  [phone] & follow prompts after 5pm & weekends Office Phone:  [phone] Office Fax:  [phone]

## 2015-11-28 DIAGNOSIS — J449 Chronic obstructive pulmonary disease, unspecified: Secondary | ICD-10-CM | POA: Diagnosis not present

## 2015-11-28 DIAGNOSIS — F419 Anxiety disorder, unspecified: Secondary | ICD-10-CM | POA: Diagnosis not present

## 2015-11-28 DIAGNOSIS — F329 Major depressive disorder, single episode, unspecified: Secondary | ICD-10-CM | POA: Diagnosis not present

## 2015-11-28 DIAGNOSIS — G300 Alzheimer's disease with early onset: Secondary | ICD-10-CM | POA: Diagnosis not present

## 2015-11-28 DIAGNOSIS — M159 Polyosteoarthritis, unspecified: Secondary | ICD-10-CM | POA: Diagnosis not present

## 2015-11-28 DIAGNOSIS — F0281 Dementia in other diseases classified elsewhere with behavioral disturbance: Secondary | ICD-10-CM | POA: Diagnosis not present

## 2015-11-29 ENCOUNTER — Other Ambulatory Visit: Payer: Self-pay

## 2015-11-29 MED ORDER — LORAZEPAM 0.5 MG PO TABS: 0.5000 mg | ORAL_TABLET | Freq: Every day | ORAL | Status: DC

## 2015-11-29 NOTE — Telephone Encounter (Signed)
Fax received requesting to change patient from [facility] gel to a tablet. Change authorized by Dr.Green

## 2015-12-04 ENCOUNTER — Telehealth: Payer: Self-pay | Admitting: *Deleted

## 2015-12-04 NOTE — Telephone Encounter (Signed)
Printed out your note and wrote hold ativan for sedation on it, signed it and placed for faxing to [facility].
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
## 2015-12-05 MED ORDER — AMBULATORY NON FORMULARY MEDICATION: Status: DC

## 2015-12-05 NOTE — Telephone Encounter (Signed)
Faxed to Facility

## 2015-12-05 NOTE — Telephone Encounter (Signed)
Melissa Hairston with [facility] called back and stated that she received the fax order and stated that while you were on vacation Dr. Chilton SiGreen changed the Ativan Gel to Ativan tablet. She stated that the pharmacy told her the only way to generate another prior authorization is to write another Rx for the Ativan Gel and send it to the pharmacy Fax#: [phone] . Please Advise.

## 2015-12-05 NOTE — Telephone Encounter (Signed)
The only other message i see is you wrote on there if the insurance sent the facility anything  and that's what Efraim KaufmannMelissa is talking about. They did and that's when Dr. Chilton SiGreen changed it to the tablets instead of the gel.

## 2015-12-05 NOTE — Addendum Note (Signed)
Addended by: Nelda SevereMAY, ANITA A on: 12/05/2015 04:23 PM   Modules accepted: Orders, Medications

## 2015-12-05 NOTE — Telephone Encounter (Signed)
Rx printed and will fax to Fax#: [phone]

## 2015-12-05 NOTE — Telephone Encounter (Signed)
See my other message
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
## 2016-01-25 ENCOUNTER — Ambulatory Visit (INDEPENDENT_AMBULATORY_CARE_PROVIDER_SITE_OTHER): Payer: Medicare Other | Admitting: Internal Medicine

## 2016-01-25 ENCOUNTER — Encounter: Payer: Self-pay | Admitting: Internal Medicine

## 2016-01-25 VITALS — BP 110/70 | HR 71 | Wt 152.0 lb

## 2016-01-25 DIAGNOSIS — G300 Alzheimer's disease with early onset: Secondary | ICD-10-CM | POA: Diagnosis not present

## 2016-01-25 DIAGNOSIS — R296 Repeated falls: Secondary | ICD-10-CM

## 2016-01-25 DIAGNOSIS — F0280 Dementia in other diseases classified elsewhere without behavioral disturbance: Secondary | ICD-10-CM

## 2016-01-25 DIAGNOSIS — F0391 Unspecified dementia with behavioral disturbance: Secondary | ICD-10-CM

## 2016-01-25 DIAGNOSIS — F03918 Unspecified dementia, unspecified severity, with other behavioral disturbance: Secondary | ICD-10-CM

## 2016-01-25 NOTE — Patient Instructions (Signed)
Will decrease seroquel to 12.5mg  po bid for one week, then stop. Monitor agitation and fall frequency. Apply ice 20 mins 4x per day to nose until swelling resolves PT to eval and tx if Kathlene NovemberMike and AspinwallKristin agree. Suspect falls are due to dementia progression, but will r/o seroquel as worsening the problem. Pain seems controlled with bid tylenol, has prn tylenol and tramadol also, but not getting used at this point.

## 2016-01-25 NOTE — Progress Notes (Signed)
[facility] Provider: Mykell Rawl L. Renato Gailseed, D.O., C.M.D.  Code Status: DNR Goals of Care:  Advanced Directives 01/25/2016  Does patient have an advance directive? Yes  Type of Advance Directive Healthcare Power of Attorney  Does patient want to make changes to advanced directive? -  Copy of advanced directive(s) in chart? Yes  Pre-existing out of facility DNR order (yellow form or pink MOST form) -   Chief Complaint  Patient presents with  . Medical Management of Chronic Issues    8 week follow-up, dizzy?    HPI: Patient is a 65 y.o. female seen today for an acute visit for unsteady gait, recent fall and f/u from last visit.  She had a face plant since that time.  She has raccoon eyes and a swollen nose.  She was not wearing her glasses since 9/4 due to pain, but is wearing them this am.  She denies pain, but cannot really communicate it.    She is unsteady walking, and her gait seems wobbly and wide-based.  She is falling more often overall.  I suspect that this is due to dementia progression, but, could be medication-related so will attempt a reduction fist.  Past Medical History:  Diagnosis Date  . Alzheimer disease 2008  . Anxiety   . COPD (chronic obstructive pulmonary disease) (HCC) 2013  . Depression   . Insomnia     Past Surgical History:  Procedure Laterality Date  . ABDOMINAL HYSTERECTOMY    . APPENDECTOMY    . carpeal tunnel repair    . CATARACT EXTRACTION, BILATERAL  July 2015  . CHOLECYSTECTOMY    . ROTATOR CUFF REPAIR      Allergies  Allergen Reactions  . Codeine Other (See Comments)    ON MAR      Medication List       Accurate as of 01/25/16  8:06 AM. Always use your most recent med list.          acetaminophen 500 MG tablet Commonly known as:  TYLENOL Take 500 mg by mouth 2 (two) times daily. And daily as needed for breakthrough   AMBULATORY NON FORMULARY MEDICATION Ativan Gel 0.5mg  Sig: Topically to neck or arms every 6 hours as  needed for agitation/anxiety   divalproex 125 MG capsule Commonly known as:  DEPAKOTE SPRINKLE Take 250 mg by mouth 2 (two) times daily.   meloxicam 15 MG tablet Commonly known as:  MOBIC Take 15 mg by mouth daily.   QUEtiapine 25 MG tablet Commonly known as:  SEROQUEL Take one tablet by mouth twice daily for agitation   traMADol 50 MG tablet Commonly known as:  ULTRAM Take 1 tablet (50 mg total) by mouth every 6 (six) hours as needed.       Review of Systems:  Review of Systems  Constitutional: Negative for chills and fever.  Respiratory: Negative for shortness of breath.   Cardiovascular: Negative for chest pain and palpitations.  Musculoskeletal: Positive for falls.       No evidence of joint or back pain now  Skin:       Bruises and swelling of eyes and nose  Neurological: Negative for dizziness.       Unsteady gait  Psychiatric/Behavioral: Positive for depression and memory loss.    Health Maintenance  Topic Date Due  . Hepatitis C Screening  April 29, 1951  . ZOSTAVAX  09/25/2009  . INFLUENZA VACCINE  12/19/2015  . PNA vac Low Risk Adult (2 of 2 -  PPSV23) 07/23/2016  . DEXA SCAN  Completed    Physical Exam: Vitals:   01/25/16 0743  BP: 110/70  Pulse: 71  SpO2: 94%  Weight: 152 lb (68.9 kg)   Body mass index is 27.8 kg/m. Physical Exam  Constitutional: She appears well-developed and well-nourished. No distress.  Eyes: Conjunctivae and EOM are normal. Pupils are equal, round, and reactive to light.  Cardiovascular: Normal rate, regular rhythm, normal heart sounds and intact distal pulses.   Pulmonary/Chest: Effort normal and breath sounds normal. No respiratory distress.  Neurological: She is alert.  Skin: Skin is warm and dry.  Ecchymoses of bilateral eyes and nose with swelling of nose  Psychiatric:  Pleasant and attempts to speak to me    Labs reviewed: Basic Metabolic Panel:  Recent Labs  16/10/96 0916  NA 143  K 4.4  CL 104  CO2 24    GLUCOSE 99  BUN 10  CREATININE 0.84  CALCIUM 9.0   Liver Function Tests: No results for input(s): AST, ALT, ALKPHOS, BILITOT, PROT, ALBUMIN in the last 8760 hours. No results for input(s): LIPASE, AMYLASE in the last 8760 hours. No results for input(s): AMMONIA in the last 8760 hours. CBC:  Recent Labs  03/09/15 0916  WBC 4.4  NEUTROABS 2.5  HCT 42.1  MCV 88  PLT 195   Lipid Panel: No results for input(s): CHOL, HDL, LDLCALC, TRIG, CHOLHDL, LDLDIRECT in the last 8760 hours. Lab Results  Component Value Date   HGBA1C 5.8 (H) 03/09/2015    Assessment/Plan 1. Alzheimer's disease, early onset -cont depakote for behaviors -off other dementia medications -is proggresing and has moved to memory care now  2. Dementia with aggressive behavior -will decrease seroquel to 12.5mg  po bid for one week, then stop; monitor for agitation and fall frequency.  3. Frequent falls -PT to eval and tx for wobbly gait  Labs/tests ordered:  No new Next appt:  2 mos f/u   Nikkita Adeyemi L. Clotile Whittington, D.O. Geriatrics [facility] [address] [place], [place] [address] Cell Phone (Mon-Fri 8am-5pm):  [phone] On Call:  [phone] & follow prompts after 5pm & weekends Office Phone:  [phone] Office Fax:  [phone]

## 2016-04-05 ENCOUNTER — Telehealth: Payer: Self-pay

## 2016-04-05 NOTE — Telephone Encounter (Signed)
Message received via fax stating resident fell after tripping on another residents belongings, Please advise   I called [facility] to get additional Information, patient with no visible injuries and does not complain of pain or discomfort. Patient was walking around fine at the time of call.  Paper(fax) given to Dr.Reed for review.

## 2016-05-27 ENCOUNTER — Encounter: Payer: Self-pay | Admitting: Internal Medicine

## 2016-05-27 ENCOUNTER — Other Ambulatory Visit: Payer: Self-pay | Admitting: *Deleted

## 2016-06-24 ENCOUNTER — Other Ambulatory Visit: Payer: Self-pay | Admitting: *Deleted

## 2016-06-24 MED ORDER — AMBULATORY NON FORMULARY MEDICATION: 5 refills | Status: DC

## 2016-06-24 NOTE — Telephone Encounter (Signed)
[facility] #[phone] for refill request

## 2016-08-12 ENCOUNTER — Ambulatory Visit: Payer: Medicare Other | Admitting: Internal Medicine

## 2016-08-12 ENCOUNTER — Telehealth: Payer: Self-pay | Admitting: *Deleted

## 2016-08-12 NOTE — Telephone Encounter (Signed)
[phone] Fax: [phone] stating Resident has been crying when urinating and holding her bladder. Please Advise.  Dr. Ernest Mallickeed's Response: Push po fluids. Check vital signs and send results (with all of these faxes, please). Assess for other symptoms of UTI. Patient still needs an appointment.   Faxed back to [facility]. Awaiting Response.

## 2016-08-22 ENCOUNTER — Encounter: Payer: Self-pay | Admitting: Internal Medicine

## 2016-08-22 ENCOUNTER — Ambulatory Visit (INDEPENDENT_AMBULATORY_CARE_PROVIDER_SITE_OTHER): Payer: Medicare Other | Admitting: Internal Medicine

## 2016-08-22 DIAGNOSIS — F0281 Dementia in other diseases classified elsewhere with behavioral disturbance: Secondary | ICD-10-CM | POA: Diagnosis not present

## 2016-08-22 DIAGNOSIS — G300 Alzheimer's disease with early onset: Secondary | ICD-10-CM

## 2016-08-22 DIAGNOSIS — R296 Repeated falls: Secondary | ICD-10-CM | POA: Diagnosis not present

## 2016-08-22 DIAGNOSIS — F02818 Dementia in other diseases classified elsewhere, unspecified severity, with other behavioral disturbance: Secondary | ICD-10-CM

## 2016-08-22 DIAGNOSIS — M159 Polyosteoarthritis, unspecified: Secondary | ICD-10-CM | POA: Diagnosis not present

## 2016-08-22 DIAGNOSIS — F3341 Major depressive disorder, recurrent, in partial remission: Secondary | ICD-10-CM | POA: Diagnosis not present

## 2016-08-22 NOTE — Progress Notes (Signed)
Location:  [facility] clinic Provider:  Emit Kuenzel L. Renato Gails, D.O., C.M.D.  Code Status: DNR Goals of Care:  Advanced Directives 08/22/2016  Does Patient Have a Medical Advance Directive? Yes  Type of Advance Directive Healthcare Power of Attorney  Does patient want to make changes to medical advance directive? -  Copy of Healthcare Power of Attorney in Chart? Yes  Pre-existing out of facility DNR order (yellow form or pink MOST form) -     Chief Complaint  Patient presents with  . Acute Visit    discuss memory, look at foot    HPI: Patient is a 66 y.o. female seen today for an acute visit for rapid decline recently including needing to be fed, more frequent falls, nonverbal to min verbal when agitated and yelling.  She has lost a tremendous amount of weight since the last appt last year.  Probably 20-30 lbs.  Could not weigh due to agitation and trying to strike and grimace/yell at [facility].  At some point between the visits, her depakote got increased to  po tid (?by another provider b/w appts via fax).  She seems very sleepy and sedated.  PRNs have not been used at all this month thus far (first 5 days including the seroquel  and ativan gel).  Her daughter-in-law brought up before I could get around to it, the idea of a hospice referral.  Her son was in agreement when benefits discussed and explained.    Past Medical History:  Diagnosis Date  . Alzheimer disease 2008  . Anxiety   . COPD (chronic obstructive pulmonary disease) (HCC) 2013  . Depression   . Insomnia     Past Surgical History:  Procedure Laterality Date  . ABDOMINAL HYSTERECTOMY    . APPENDECTOMY    . carpeal tunnel repair    . CATARACT EXTRACTION, BILATERAL  July 2015  . CHOLECYSTECTOMY    . ROTATOR CUFF REPAIR      Allergies  Allergen Reactions  . Codeine Other (See Comments)    ON MAR    Allergies as of 08/22/2016      Reactions   Codeine Other (See Comments)   ON MAR      Medication List         Accurate as of 08/22/16  2:38 PM. Always use your most recent med list.          acetaminophen 500 MG tablet Commonly known as:  TYLENOL Take 500 mg by mouth 2 (two) times daily. And daily as needed for breakthrough   AMBULATORY NON FORMULARY MEDICATION Ativan Gel 0.5mg  Sig: Topically to neck or arms every 6 hours as needed for agitation/anxiety   divalproex 125 MG capsule Commonly known as:  DEPAKOTE SPRINKLE Take 250 mg by mouth 2 (two) times daily.   meloxicam 15 MG tablet Commonly known as:  MOBIC Take 15 mg by mouth daily.   QUEtiapine 25 MG tablet Commonly known as:  SEROQUEL Take 25 mg by mouth at bedtime.   traMADol 50 MG tablet Commonly known as:  ULTRAM Take 1 tablet (50 mg total) by mouth every 6 (six) hours as needed.       Review of Systems:  ROS  Health Maintenance  Topic Date Due  . Hepatitis C Screening  [birth year]  . PNA vac Low Risk Adult (2 of 2 - PPSV23) 07/23/2016  . INFLUENZA VACCINE  12/18/2016  . DEXA SCAN  Completed    Physical Exam: Vitals:   There is no height  or weight on file to calculate BMI. Physical Exam  Labs reviewed: Basic Metabolic Panel: No results for input(s): NA, K, CL, CO2, GLUCOSE, BUN, CREATININE, CALCIUM, MG, PHOS, TSH in the last 8760 hours. Liver Function Tests: No results for input(s): AST, ALT, ALKPHOS, BILITOT, PROT, ALBUMIN in the last 8760 hours. No results for input(s): LIPASE, AMYLASE in the last 8760 hours. No results for input(s): AMMONIA in the last 8760 hours. CBC: No results for input(s): WBC, NEUTROABS, HGB, HCT, MCV, PLT in the last 8760 hours. Lipid Panel: No results for input(s): CHOL, HDL, LDLCALC, TRIG, CHOLHDL, LDLDIRECT in the last 8760 hours. Lab Results  Component Value Date   HGBA1C 5.8 (H) 03/09/2015    Procedures since last visit: No results found.  Assessment/Plan 1. Early onset Alzheimer's disease with behavioral disturbance -decrease depakote  po bid, cont other prns but  staff not using them lately -is end stage and hospice referral was placed at this point -labs could not be obtained due to agitation and frustration -pt keeps eyes partly closed and grimaces and yells out when she cannot determine where to walk next (corner of exam room for example) -?current vision  -[facility] referral was placed  2. Generalized osteoarthritis of multiple sites -cont tylenol bid (favored increasing to tid but Baxter Hire wanted to wait for hospice input) -tramadol also available prn, but not getting used if patient crying which seems like it might be a good idea  3. Recurrent major depressive disorder, in partial remission (HCC) -not currently on antidepressant, had been on cymbalta at one point but family had requested we stop it  4. Frequent falls - ongoing and more often found on the floor (seems she cannot see where she is going and is very weak and has lost muscle mass), end stage dementia with less than 6 mos prognosis  Labs/tests ordered:  Wanted depakote level b/c likely toxic--could not get with pt's level of agitation today so will simply reduce dose  Next appt:  prn   Khalen Styer L. Burk Hoctor, D.O. Geriatrics [facility] [address], [place] [address] Cell Phone (Mon-Fri 8am-5pm):  [phone] On Call:  [phone] & follow prompts after 5pm & weekends Office Phone:  [phone] Office Fax:  [phone]

## 2016-08-23 DIAGNOSIS — G309 Alzheimer's disease, unspecified: Secondary | ICD-10-CM | POA: Diagnosis not present

## 2016-08-23 DIAGNOSIS — F339 Major depressive disorder, recurrent, unspecified: Secondary | ICD-10-CM | POA: Diagnosis not present

## 2016-08-23 DIAGNOSIS — R634 Abnormal weight loss: Secondary | ICD-10-CM | POA: Diagnosis not present

## 2016-08-23 DIAGNOSIS — F411 Generalized anxiety disorder: Secondary | ICD-10-CM | POA: Diagnosis not present

## 2016-08-23 DIAGNOSIS — E46 Unspecified protein-calorie malnutrition: Secondary | ICD-10-CM | POA: Diagnosis not present

## 2016-08-23 DIAGNOSIS — F0281 Dementia in other diseases classified elsewhere with behavioral disturbance: Secondary | ICD-10-CM | POA: Diagnosis not present

## 2016-08-26 DIAGNOSIS — F0281 Dementia in other diseases classified elsewhere with behavioral disturbance: Secondary | ICD-10-CM | POA: Diagnosis not present

## 2016-08-26 DIAGNOSIS — E46 Unspecified protein-calorie malnutrition: Secondary | ICD-10-CM | POA: Diagnosis not present

## 2016-08-26 DIAGNOSIS — G309 Alzheimer's disease, unspecified: Secondary | ICD-10-CM | POA: Diagnosis not present

## 2016-08-26 DIAGNOSIS — F411 Generalized anxiety disorder: Secondary | ICD-10-CM | POA: Diagnosis not present

## 2016-08-26 DIAGNOSIS — R634 Abnormal weight loss: Secondary | ICD-10-CM | POA: Diagnosis not present

## 2016-08-26 DIAGNOSIS — F339 Major depressive disorder, recurrent, unspecified: Secondary | ICD-10-CM | POA: Diagnosis not present

## 2016-08-27 DIAGNOSIS — G309 Alzheimer's disease, unspecified: Secondary | ICD-10-CM | POA: Diagnosis not present

## 2016-08-27 DIAGNOSIS — F339 Major depressive disorder, recurrent, unspecified: Secondary | ICD-10-CM | POA: Diagnosis not present

## 2016-08-27 DIAGNOSIS — E46 Unspecified protein-calorie malnutrition: Secondary | ICD-10-CM | POA: Diagnosis not present

## 2016-08-27 DIAGNOSIS — R634 Abnormal weight loss: Secondary | ICD-10-CM | POA: Diagnosis not present

## 2016-08-27 DIAGNOSIS — F0281 Dementia in other diseases classified elsewhere with behavioral disturbance: Secondary | ICD-10-CM | POA: Diagnosis not present

## 2016-08-27 DIAGNOSIS — F411 Generalized anxiety disorder: Secondary | ICD-10-CM | POA: Diagnosis not present

## 2016-08-29 DIAGNOSIS — F0281 Dementia in other diseases classified elsewhere with behavioral disturbance: Secondary | ICD-10-CM | POA: Diagnosis not present

## 2016-08-29 DIAGNOSIS — R634 Abnormal weight loss: Secondary | ICD-10-CM | POA: Diagnosis not present

## 2016-08-29 DIAGNOSIS — F411 Generalized anxiety disorder: Secondary | ICD-10-CM | POA: Diagnosis not present

## 2016-08-29 DIAGNOSIS — G309 Alzheimer's disease, unspecified: Secondary | ICD-10-CM | POA: Diagnosis not present

## 2016-08-29 DIAGNOSIS — E46 Unspecified protein-calorie malnutrition: Secondary | ICD-10-CM | POA: Diagnosis not present

## 2016-08-29 DIAGNOSIS — F339 Major depressive disorder, recurrent, unspecified: Secondary | ICD-10-CM | POA: Diagnosis not present

## 2016-09-02 DIAGNOSIS — E46 Unspecified protein-calorie malnutrition: Secondary | ICD-10-CM | POA: Diagnosis not present

## 2016-09-02 DIAGNOSIS — F411 Generalized anxiety disorder: Secondary | ICD-10-CM | POA: Diagnosis not present

## 2016-09-02 DIAGNOSIS — F0281 Dementia in other diseases classified elsewhere with behavioral disturbance: Secondary | ICD-10-CM | POA: Diagnosis not present

## 2016-09-02 DIAGNOSIS — R634 Abnormal weight loss: Secondary | ICD-10-CM | POA: Diagnosis not present

## 2016-09-02 DIAGNOSIS — F339 Major depressive disorder, recurrent, unspecified: Secondary | ICD-10-CM | POA: Diagnosis not present

## 2016-09-02 DIAGNOSIS — G309 Alzheimer's disease, unspecified: Secondary | ICD-10-CM | POA: Diagnosis not present

## 2016-09-03 DIAGNOSIS — E46 Unspecified protein-calorie malnutrition: Secondary | ICD-10-CM | POA: Diagnosis not present

## 2016-09-03 DIAGNOSIS — F339 Major depressive disorder, recurrent, unspecified: Secondary | ICD-10-CM | POA: Diagnosis not present

## 2016-09-03 DIAGNOSIS — R634 Abnormal weight loss: Secondary | ICD-10-CM | POA: Diagnosis not present

## 2016-09-03 DIAGNOSIS — G309 Alzheimer's disease, unspecified: Secondary | ICD-10-CM | POA: Diagnosis not present

## 2016-09-03 DIAGNOSIS — F0281 Dementia in other diseases classified elsewhere with behavioral disturbance: Secondary | ICD-10-CM | POA: Diagnosis not present

## 2016-09-03 DIAGNOSIS — F411 Generalized anxiety disorder: Secondary | ICD-10-CM | POA: Diagnosis not present

## 2016-09-04 DIAGNOSIS — F411 Generalized anxiety disorder: Secondary | ICD-10-CM | POA: Diagnosis not present

## 2016-09-04 DIAGNOSIS — G309 Alzheimer's disease, unspecified: Secondary | ICD-10-CM | POA: Diagnosis not present

## 2016-09-04 DIAGNOSIS — F339 Major depressive disorder, recurrent, unspecified: Secondary | ICD-10-CM | POA: Diagnosis not present

## 2016-09-04 DIAGNOSIS — F0281 Dementia in other diseases classified elsewhere with behavioral disturbance: Secondary | ICD-10-CM | POA: Diagnosis not present

## 2016-09-04 DIAGNOSIS — E46 Unspecified protein-calorie malnutrition: Secondary | ICD-10-CM | POA: Diagnosis not present

## 2016-09-04 DIAGNOSIS — R634 Abnormal weight loss: Secondary | ICD-10-CM | POA: Diagnosis not present

## 2016-09-05 DIAGNOSIS — E46 Unspecified protein-calorie malnutrition: Secondary | ICD-10-CM | POA: Diagnosis not present

## 2016-09-05 DIAGNOSIS — F0281 Dementia in other diseases classified elsewhere with behavioral disturbance: Secondary | ICD-10-CM | POA: Diagnosis not present

## 2016-09-05 DIAGNOSIS — F411 Generalized anxiety disorder: Secondary | ICD-10-CM | POA: Diagnosis not present

## 2016-09-05 DIAGNOSIS — R634 Abnormal weight loss: Secondary | ICD-10-CM | POA: Diagnosis not present

## 2016-09-05 DIAGNOSIS — G309 Alzheimer's disease, unspecified: Secondary | ICD-10-CM | POA: Diagnosis not present

## 2016-09-05 DIAGNOSIS — F339 Major depressive disorder, recurrent, unspecified: Secondary | ICD-10-CM | POA: Diagnosis not present

## 2016-09-06 DIAGNOSIS — E46 Unspecified protein-calorie malnutrition: Secondary | ICD-10-CM | POA: Diagnosis not present

## 2016-09-06 DIAGNOSIS — R634 Abnormal weight loss: Secondary | ICD-10-CM | POA: Diagnosis not present

## 2016-09-06 DIAGNOSIS — F0281 Dementia in other diseases classified elsewhere with behavioral disturbance: Secondary | ICD-10-CM | POA: Diagnosis not present

## 2016-09-06 DIAGNOSIS — F339 Major depressive disorder, recurrent, unspecified: Secondary | ICD-10-CM | POA: Diagnosis not present

## 2016-09-06 DIAGNOSIS — G309 Alzheimer's disease, unspecified: Secondary | ICD-10-CM | POA: Diagnosis not present

## 2016-09-06 DIAGNOSIS — F411 Generalized anxiety disorder: Secondary | ICD-10-CM | POA: Diagnosis not present

## 2016-09-10 DIAGNOSIS — G309 Alzheimer's disease, unspecified: Secondary | ICD-10-CM | POA: Diagnosis not present

## 2016-09-10 DIAGNOSIS — F0281 Dementia in other diseases classified elsewhere with behavioral disturbance: Secondary | ICD-10-CM | POA: Diagnosis not present

## 2016-09-10 DIAGNOSIS — F411 Generalized anxiety disorder: Secondary | ICD-10-CM | POA: Diagnosis not present

## 2016-09-10 DIAGNOSIS — F339 Major depressive disorder, recurrent, unspecified: Secondary | ICD-10-CM | POA: Diagnosis not present

## 2016-09-10 DIAGNOSIS — E46 Unspecified protein-calorie malnutrition: Secondary | ICD-10-CM | POA: Diagnosis not present

## 2016-09-10 DIAGNOSIS — R634 Abnormal weight loss: Secondary | ICD-10-CM | POA: Diagnosis not present

## 2016-09-12 DIAGNOSIS — F0281 Dementia in other diseases classified elsewhere with behavioral disturbance: Secondary | ICD-10-CM | POA: Diagnosis not present

## 2016-09-12 DIAGNOSIS — R634 Abnormal weight loss: Secondary | ICD-10-CM | POA: Diagnosis not present

## 2016-09-12 DIAGNOSIS — G309 Alzheimer's disease, unspecified: Secondary | ICD-10-CM | POA: Diagnosis not present

## 2016-09-12 DIAGNOSIS — E46 Unspecified protein-calorie malnutrition: Secondary | ICD-10-CM | POA: Diagnosis not present

## 2016-09-12 DIAGNOSIS — F339 Major depressive disorder, recurrent, unspecified: Secondary | ICD-10-CM | POA: Diagnosis not present

## 2016-09-12 DIAGNOSIS — F411 Generalized anxiety disorder: Secondary | ICD-10-CM | POA: Diagnosis not present

## 2016-09-13 DIAGNOSIS — E46 Unspecified protein-calorie malnutrition: Secondary | ICD-10-CM | POA: Diagnosis not present

## 2016-09-13 DIAGNOSIS — F0281 Dementia in other diseases classified elsewhere with behavioral disturbance: Secondary | ICD-10-CM | POA: Diagnosis not present

## 2016-09-13 DIAGNOSIS — F411 Generalized anxiety disorder: Secondary | ICD-10-CM | POA: Diagnosis not present

## 2016-09-13 DIAGNOSIS — F339 Major depressive disorder, recurrent, unspecified: Secondary | ICD-10-CM | POA: Diagnosis not present

## 2016-09-13 DIAGNOSIS — R634 Abnormal weight loss: Secondary | ICD-10-CM | POA: Diagnosis not present

## 2016-09-13 DIAGNOSIS — G309 Alzheimer's disease, unspecified: Secondary | ICD-10-CM | POA: Diagnosis not present

## 2016-09-17 DIAGNOSIS — F0281 Dementia in other diseases classified elsewhere with behavioral disturbance: Secondary | ICD-10-CM | POA: Diagnosis not present

## 2016-09-17 DIAGNOSIS — F339 Major depressive disorder, recurrent, unspecified: Secondary | ICD-10-CM | POA: Diagnosis not present

## 2016-09-17 DIAGNOSIS — R634 Abnormal weight loss: Secondary | ICD-10-CM | POA: Diagnosis not present

## 2016-09-17 DIAGNOSIS — E46 Unspecified protein-calorie malnutrition: Secondary | ICD-10-CM | POA: Diagnosis not present

## 2016-09-17 DIAGNOSIS — F411 Generalized anxiety disorder: Secondary | ICD-10-CM | POA: Diagnosis not present

## 2016-09-17 DIAGNOSIS — G309 Alzheimer's disease, unspecified: Secondary | ICD-10-CM | POA: Diagnosis not present

## 2016-09-19 DIAGNOSIS — F339 Major depressive disorder, recurrent, unspecified: Secondary | ICD-10-CM | POA: Diagnosis not present

## 2016-09-19 DIAGNOSIS — F0281 Dementia in other diseases classified elsewhere with behavioral disturbance: Secondary | ICD-10-CM | POA: Diagnosis not present

## 2016-09-19 DIAGNOSIS — G309 Alzheimer's disease, unspecified: Secondary | ICD-10-CM | POA: Diagnosis not present

## 2016-09-19 DIAGNOSIS — E46 Unspecified protein-calorie malnutrition: Secondary | ICD-10-CM | POA: Diagnosis not present

## 2016-09-19 DIAGNOSIS — F411 Generalized anxiety disorder: Secondary | ICD-10-CM | POA: Diagnosis not present

## 2016-09-19 DIAGNOSIS — R634 Abnormal weight loss: Secondary | ICD-10-CM | POA: Diagnosis not present

## 2016-09-20 DIAGNOSIS — F339 Major depressive disorder, recurrent, unspecified: Secondary | ICD-10-CM | POA: Diagnosis not present

## 2016-09-20 DIAGNOSIS — E46 Unspecified protein-calorie malnutrition: Secondary | ICD-10-CM | POA: Diagnosis not present

## 2016-09-20 DIAGNOSIS — G309 Alzheimer's disease, unspecified: Secondary | ICD-10-CM | POA: Diagnosis not present

## 2016-09-20 DIAGNOSIS — F0281 Dementia in other diseases classified elsewhere with behavioral disturbance: Secondary | ICD-10-CM | POA: Diagnosis not present

## 2016-09-20 DIAGNOSIS — R634 Abnormal weight loss: Secondary | ICD-10-CM | POA: Diagnosis not present

## 2016-09-20 DIAGNOSIS — F411 Generalized anxiety disorder: Secondary | ICD-10-CM | POA: Diagnosis not present

## 2016-09-23 DIAGNOSIS — E46 Unspecified protein-calorie malnutrition: Secondary | ICD-10-CM | POA: Diagnosis not present

## 2016-09-23 DIAGNOSIS — F0281 Dementia in other diseases classified elsewhere with behavioral disturbance: Secondary | ICD-10-CM | POA: Diagnosis not present

## 2016-09-23 DIAGNOSIS — F411 Generalized anxiety disorder: Secondary | ICD-10-CM | POA: Diagnosis not present

## 2016-09-23 DIAGNOSIS — G309 Alzheimer's disease, unspecified: Secondary | ICD-10-CM | POA: Diagnosis not present

## 2016-09-23 DIAGNOSIS — R634 Abnormal weight loss: Secondary | ICD-10-CM | POA: Diagnosis not present

## 2016-09-23 DIAGNOSIS — F339 Major depressive disorder, recurrent, unspecified: Secondary | ICD-10-CM | POA: Diagnosis not present

## 2016-09-24 DIAGNOSIS — F339 Major depressive disorder, recurrent, unspecified: Secondary | ICD-10-CM | POA: Diagnosis not present

## 2016-09-24 DIAGNOSIS — F411 Generalized anxiety disorder: Secondary | ICD-10-CM | POA: Diagnosis not present

## 2016-09-24 DIAGNOSIS — E46 Unspecified protein-calorie malnutrition: Secondary | ICD-10-CM | POA: Diagnosis not present

## 2016-09-24 DIAGNOSIS — G309 Alzheimer's disease, unspecified: Secondary | ICD-10-CM | POA: Diagnosis not present

## 2016-09-24 DIAGNOSIS — R634 Abnormal weight loss: Secondary | ICD-10-CM | POA: Diagnosis not present

## 2016-09-24 DIAGNOSIS — F0281 Dementia in other diseases classified elsewhere with behavioral disturbance: Secondary | ICD-10-CM | POA: Diagnosis not present

## 2016-09-26 ENCOUNTER — Other Ambulatory Visit: Payer: Self-pay

## 2016-09-26 DIAGNOSIS — R634 Abnormal weight loss: Secondary | ICD-10-CM | POA: Diagnosis not present

## 2016-09-26 DIAGNOSIS — F411 Generalized anxiety disorder: Secondary | ICD-10-CM | POA: Diagnosis not present

## 2016-09-26 DIAGNOSIS — E46 Unspecified protein-calorie malnutrition: Secondary | ICD-10-CM | POA: Diagnosis not present

## 2016-09-26 DIAGNOSIS — F0281 Dementia in other diseases classified elsewhere with behavioral disturbance: Secondary | ICD-10-CM | POA: Diagnosis not present

## 2016-09-26 DIAGNOSIS — F339 Major depressive disorder, recurrent, unspecified: Secondary | ICD-10-CM | POA: Diagnosis not present

## 2016-09-26 DIAGNOSIS — G309 Alzheimer's disease, unspecified: Secondary | ICD-10-CM | POA: Diagnosis not present

## 2016-09-26 MED ORDER — TRAMADOL HCL 50 MG PO TABS: 50.0000 mg | ORAL_TABLET | Freq: Four times a day (QID) | ORAL | 0 refills | Status: DC | PRN

## 2016-09-26 NOTE — Telephone Encounter (Signed)
[facility]

## 2016-09-30 DIAGNOSIS — G309 Alzheimer's disease, unspecified: Secondary | ICD-10-CM | POA: Diagnosis not present

## 2016-09-30 DIAGNOSIS — F0281 Dementia in other diseases classified elsewhere with behavioral disturbance: Secondary | ICD-10-CM | POA: Diagnosis not present

## 2016-09-30 DIAGNOSIS — R634 Abnormal weight loss: Secondary | ICD-10-CM | POA: Diagnosis not present

## 2016-09-30 DIAGNOSIS — E46 Unspecified protein-calorie malnutrition: Secondary | ICD-10-CM | POA: Diagnosis not present

## 2016-09-30 DIAGNOSIS — F411 Generalized anxiety disorder: Secondary | ICD-10-CM | POA: Diagnosis not present

## 2016-09-30 DIAGNOSIS — F339 Major depressive disorder, recurrent, unspecified: Secondary | ICD-10-CM | POA: Diagnosis not present

## 2016-10-01 DIAGNOSIS — F339 Major depressive disorder, recurrent, unspecified: Secondary | ICD-10-CM | POA: Diagnosis not present

## 2016-10-01 DIAGNOSIS — F0281 Dementia in other diseases classified elsewhere with behavioral disturbance: Secondary | ICD-10-CM | POA: Diagnosis not present

## 2016-10-01 DIAGNOSIS — F411 Generalized anxiety disorder: Secondary | ICD-10-CM | POA: Diagnosis not present

## 2016-10-01 DIAGNOSIS — G309 Alzheimer's disease, unspecified: Secondary | ICD-10-CM | POA: Diagnosis not present

## 2016-10-01 DIAGNOSIS — E46 Unspecified protein-calorie malnutrition: Secondary | ICD-10-CM | POA: Diagnosis not present

## 2016-10-01 DIAGNOSIS — R634 Abnormal weight loss: Secondary | ICD-10-CM | POA: Diagnosis not present

## 2016-10-03 ENCOUNTER — Other Ambulatory Visit: Payer: Self-pay

## 2016-10-03 DIAGNOSIS — E46 Unspecified protein-calorie malnutrition: Secondary | ICD-10-CM | POA: Diagnosis not present

## 2016-10-03 DIAGNOSIS — F411 Generalized anxiety disorder: Secondary | ICD-10-CM | POA: Diagnosis not present

## 2016-10-03 DIAGNOSIS — F0281 Dementia in other diseases classified elsewhere with behavioral disturbance: Secondary | ICD-10-CM | POA: Diagnosis not present

## 2016-10-03 DIAGNOSIS — R634 Abnormal weight loss: Secondary | ICD-10-CM | POA: Diagnosis not present

## 2016-10-03 DIAGNOSIS — G309 Alzheimer's disease, unspecified: Secondary | ICD-10-CM | POA: Diagnosis not present

## 2016-10-03 DIAGNOSIS — F339 Major depressive disorder, recurrent, unspecified: Secondary | ICD-10-CM | POA: Diagnosis not present

## 2016-10-03 MED ORDER — AMBULATORY NON FORMULARY MEDICATION: 5 refills | Status: DC

## 2016-10-03 NOTE — Telephone Encounter (Signed)
[facility] printed[facility]

## 2016-10-04 DIAGNOSIS — F339 Major depressive disorder, recurrent, unspecified: Secondary | ICD-10-CM | POA: Diagnosis not present

## 2016-10-04 DIAGNOSIS — F0281 Dementia in other diseases classified elsewhere with behavioral disturbance: Secondary | ICD-10-CM | POA: Diagnosis not present

## 2016-10-04 DIAGNOSIS — R634 Abnormal weight loss: Secondary | ICD-10-CM | POA: Diagnosis not present

## 2016-10-04 DIAGNOSIS — E46 Unspecified protein-calorie malnutrition: Secondary | ICD-10-CM | POA: Diagnosis not present

## 2016-10-04 DIAGNOSIS — G309 Alzheimer's disease, unspecified: Secondary | ICD-10-CM | POA: Diagnosis not present

## 2016-10-04 DIAGNOSIS — F411 Generalized anxiety disorder: Secondary | ICD-10-CM | POA: Diagnosis not present

## 2016-10-08 DIAGNOSIS — F411 Generalized anxiety disorder: Secondary | ICD-10-CM | POA: Diagnosis not present

## 2016-10-08 DIAGNOSIS — E46 Unspecified protein-calorie malnutrition: Secondary | ICD-10-CM | POA: Diagnosis not present

## 2016-10-08 DIAGNOSIS — F339 Major depressive disorder, recurrent, unspecified: Secondary | ICD-10-CM | POA: Diagnosis not present

## 2016-10-08 DIAGNOSIS — R634 Abnormal weight loss: Secondary | ICD-10-CM | POA: Diagnosis not present

## 2016-10-08 DIAGNOSIS — F0281 Dementia in other diseases classified elsewhere with behavioral disturbance: Secondary | ICD-10-CM | POA: Diagnosis not present

## 2016-10-08 DIAGNOSIS — G309 Alzheimer's disease, unspecified: Secondary | ICD-10-CM | POA: Diagnosis not present

## 2016-10-10 DIAGNOSIS — E46 Unspecified protein-calorie malnutrition: Secondary | ICD-10-CM | POA: Diagnosis not present

## 2016-10-10 DIAGNOSIS — R634 Abnormal weight loss: Secondary | ICD-10-CM | POA: Diagnosis not present

## 2016-10-10 DIAGNOSIS — F411 Generalized anxiety disorder: Secondary | ICD-10-CM | POA: Diagnosis not present

## 2016-10-10 DIAGNOSIS — G309 Alzheimer's disease, unspecified: Secondary | ICD-10-CM | POA: Diagnosis not present

## 2016-10-10 DIAGNOSIS — F339 Major depressive disorder, recurrent, unspecified: Secondary | ICD-10-CM | POA: Diagnosis not present

## 2016-10-10 DIAGNOSIS — F0281 Dementia in other diseases classified elsewhere with behavioral disturbance: Secondary | ICD-10-CM | POA: Diagnosis not present

## 2016-10-11 DIAGNOSIS — F339 Major depressive disorder, recurrent, unspecified: Secondary | ICD-10-CM | POA: Diagnosis not present

## 2016-10-11 DIAGNOSIS — E46 Unspecified protein-calorie malnutrition: Secondary | ICD-10-CM | POA: Diagnosis not present

## 2016-10-11 DIAGNOSIS — F411 Generalized anxiety disorder: Secondary | ICD-10-CM | POA: Diagnosis not present

## 2016-10-11 DIAGNOSIS — R634 Abnormal weight loss: Secondary | ICD-10-CM | POA: Diagnosis not present

## 2016-10-11 DIAGNOSIS — F0281 Dementia in other diseases classified elsewhere with behavioral disturbance: Secondary | ICD-10-CM | POA: Diagnosis not present

## 2016-10-11 DIAGNOSIS — G309 Alzheimer's disease, unspecified: Secondary | ICD-10-CM | POA: Diagnosis not present

## 2016-10-15 DIAGNOSIS — F339 Major depressive disorder, recurrent, unspecified: Secondary | ICD-10-CM | POA: Diagnosis not present

## 2016-10-15 DIAGNOSIS — E46 Unspecified protein-calorie malnutrition: Secondary | ICD-10-CM | POA: Diagnosis not present

## 2016-10-15 DIAGNOSIS — F0281 Dementia in other diseases classified elsewhere with behavioral disturbance: Secondary | ICD-10-CM | POA: Diagnosis not present

## 2016-10-15 DIAGNOSIS — G309 Alzheimer's disease, unspecified: Secondary | ICD-10-CM | POA: Diagnosis not present

## 2016-10-15 DIAGNOSIS — R634 Abnormal weight loss: Secondary | ICD-10-CM | POA: Diagnosis not present

## 2016-10-15 DIAGNOSIS — F411 Generalized anxiety disorder: Secondary | ICD-10-CM | POA: Diagnosis not present

## 2016-10-17 DIAGNOSIS — R634 Abnormal weight loss: Secondary | ICD-10-CM | POA: Diagnosis not present

## 2016-10-17 DIAGNOSIS — F0281 Dementia in other diseases classified elsewhere with behavioral disturbance: Secondary | ICD-10-CM | POA: Diagnosis not present

## 2016-10-17 DIAGNOSIS — G309 Alzheimer's disease, unspecified: Secondary | ICD-10-CM | POA: Diagnosis not present

## 2016-10-17 DIAGNOSIS — F411 Generalized anxiety disorder: Secondary | ICD-10-CM | POA: Diagnosis not present

## 2016-10-17 DIAGNOSIS — F339 Major depressive disorder, recurrent, unspecified: Secondary | ICD-10-CM | POA: Diagnosis not present

## 2016-10-17 DIAGNOSIS — E46 Unspecified protein-calorie malnutrition: Secondary | ICD-10-CM | POA: Diagnosis not present

## 2016-10-18 DIAGNOSIS — G309 Alzheimer's disease, unspecified: Secondary | ICD-10-CM | POA: Diagnosis not present

## 2016-10-18 DIAGNOSIS — F339 Major depressive disorder, recurrent, unspecified: Secondary | ICD-10-CM | POA: Diagnosis not present

## 2016-10-18 DIAGNOSIS — F0281 Dementia in other diseases classified elsewhere with behavioral disturbance: Secondary | ICD-10-CM | POA: Diagnosis not present

## 2016-10-18 DIAGNOSIS — R634 Abnormal weight loss: Secondary | ICD-10-CM | POA: Diagnosis not present

## 2016-10-18 DIAGNOSIS — E46 Unspecified protein-calorie malnutrition: Secondary | ICD-10-CM | POA: Diagnosis not present

## 2016-10-18 DIAGNOSIS — F411 Generalized anxiety disorder: Secondary | ICD-10-CM | POA: Diagnosis not present

## 2016-10-20 DIAGNOSIS — R634 Abnormal weight loss: Secondary | ICD-10-CM | POA: Diagnosis not present

## 2016-10-20 DIAGNOSIS — E46 Unspecified protein-calorie malnutrition: Secondary | ICD-10-CM | POA: Diagnosis not present

## 2016-10-20 DIAGNOSIS — F339 Major depressive disorder, recurrent, unspecified: Secondary | ICD-10-CM | POA: Diagnosis not present

## 2016-10-20 DIAGNOSIS — G309 Alzheimer's disease, unspecified: Secondary | ICD-10-CM | POA: Diagnosis not present

## 2016-10-20 DIAGNOSIS — F0281 Dementia in other diseases classified elsewhere with behavioral disturbance: Secondary | ICD-10-CM | POA: Diagnosis not present

## 2016-10-20 DIAGNOSIS — F411 Generalized anxiety disorder: Secondary | ICD-10-CM | POA: Diagnosis not present

## 2016-10-21 DIAGNOSIS — F411 Generalized anxiety disorder: Secondary | ICD-10-CM | POA: Diagnosis not present

## 2016-10-21 DIAGNOSIS — F0281 Dementia in other diseases classified elsewhere with behavioral disturbance: Secondary | ICD-10-CM | POA: Diagnosis not present

## 2016-10-21 DIAGNOSIS — G309 Alzheimer's disease, unspecified: Secondary | ICD-10-CM | POA: Diagnosis not present

## 2016-10-21 DIAGNOSIS — R634 Abnormal weight loss: Secondary | ICD-10-CM | POA: Diagnosis not present

## 2016-10-21 DIAGNOSIS — F339 Major depressive disorder, recurrent, unspecified: Secondary | ICD-10-CM | POA: Diagnosis not present

## 2016-10-21 DIAGNOSIS — E46 Unspecified protein-calorie malnutrition: Secondary | ICD-10-CM | POA: Diagnosis not present

## 2016-10-22 DIAGNOSIS — F411 Generalized anxiety disorder: Secondary | ICD-10-CM | POA: Diagnosis not present

## 2016-10-22 DIAGNOSIS — E46 Unspecified protein-calorie malnutrition: Secondary | ICD-10-CM | POA: Diagnosis not present

## 2016-10-22 DIAGNOSIS — G309 Alzheimer's disease, unspecified: Secondary | ICD-10-CM | POA: Diagnosis not present

## 2016-10-22 DIAGNOSIS — R634 Abnormal weight loss: Secondary | ICD-10-CM | POA: Diagnosis not present

## 2016-10-22 DIAGNOSIS — F0281 Dementia in other diseases classified elsewhere with behavioral disturbance: Secondary | ICD-10-CM | POA: Diagnosis not present

## 2016-10-22 DIAGNOSIS — F339 Major depressive disorder, recurrent, unspecified: Secondary | ICD-10-CM | POA: Diagnosis not present

## 2016-10-24 DIAGNOSIS — E46 Unspecified protein-calorie malnutrition: Secondary | ICD-10-CM | POA: Diagnosis not present

## 2016-10-24 DIAGNOSIS — F411 Generalized anxiety disorder: Secondary | ICD-10-CM | POA: Diagnosis not present

## 2016-10-24 DIAGNOSIS — G309 Alzheimer's disease, unspecified: Secondary | ICD-10-CM | POA: Diagnosis not present

## 2016-10-24 DIAGNOSIS — F0281 Dementia in other diseases classified elsewhere with behavioral disturbance: Secondary | ICD-10-CM | POA: Diagnosis not present

## 2016-10-24 DIAGNOSIS — F339 Major depressive disorder, recurrent, unspecified: Secondary | ICD-10-CM | POA: Diagnosis not present

## 2016-10-24 DIAGNOSIS — R634 Abnormal weight loss: Secondary | ICD-10-CM | POA: Diagnosis not present

## 2016-10-25 DIAGNOSIS — G309 Alzheimer's disease, unspecified: Secondary | ICD-10-CM | POA: Diagnosis not present

## 2016-10-25 DIAGNOSIS — F0281 Dementia in other diseases classified elsewhere with behavioral disturbance: Secondary | ICD-10-CM | POA: Diagnosis not present

## 2016-10-25 DIAGNOSIS — F339 Major depressive disorder, recurrent, unspecified: Secondary | ICD-10-CM | POA: Diagnosis not present

## 2016-10-25 DIAGNOSIS — F411 Generalized anxiety disorder: Secondary | ICD-10-CM | POA: Diagnosis not present

## 2016-10-25 DIAGNOSIS — R634 Abnormal weight loss: Secondary | ICD-10-CM | POA: Diagnosis not present

## 2016-10-25 DIAGNOSIS — E46 Unspecified protein-calorie malnutrition: Secondary | ICD-10-CM | POA: Diagnosis not present

## 2016-10-28 DIAGNOSIS — G309 Alzheimer's disease, unspecified: Secondary | ICD-10-CM | POA: Diagnosis not present

## 2016-10-28 DIAGNOSIS — R634 Abnormal weight loss: Secondary | ICD-10-CM | POA: Diagnosis not present

## 2016-10-28 DIAGNOSIS — E46 Unspecified protein-calorie malnutrition: Secondary | ICD-10-CM | POA: Diagnosis not present

## 2016-10-28 DIAGNOSIS — F0281 Dementia in other diseases classified elsewhere with behavioral disturbance: Secondary | ICD-10-CM | POA: Diagnosis not present

## 2016-10-28 DIAGNOSIS — F339 Major depressive disorder, recurrent, unspecified: Secondary | ICD-10-CM | POA: Diagnosis not present

## 2016-10-28 DIAGNOSIS — F411 Generalized anxiety disorder: Secondary | ICD-10-CM | POA: Diagnosis not present

## 2016-10-29 DIAGNOSIS — F339 Major depressive disorder, recurrent, unspecified: Secondary | ICD-10-CM | POA: Diagnosis not present

## 2016-10-29 DIAGNOSIS — R634 Abnormal weight loss: Secondary | ICD-10-CM | POA: Diagnosis not present

## 2016-10-29 DIAGNOSIS — G309 Alzheimer's disease, unspecified: Secondary | ICD-10-CM | POA: Diagnosis not present

## 2016-10-29 DIAGNOSIS — F0281 Dementia in other diseases classified elsewhere with behavioral disturbance: Secondary | ICD-10-CM | POA: Diagnosis not present

## 2016-10-29 DIAGNOSIS — F411 Generalized anxiety disorder: Secondary | ICD-10-CM | POA: Diagnosis not present

## 2016-10-29 DIAGNOSIS — E46 Unspecified protein-calorie malnutrition: Secondary | ICD-10-CM | POA: Diagnosis not present

## 2016-10-31 DIAGNOSIS — F0281 Dementia in other diseases classified elsewhere with behavioral disturbance: Secondary | ICD-10-CM | POA: Diagnosis not present

## 2016-10-31 DIAGNOSIS — E46 Unspecified protein-calorie malnutrition: Secondary | ICD-10-CM | POA: Diagnosis not present

## 2016-10-31 DIAGNOSIS — F411 Generalized anxiety disorder: Secondary | ICD-10-CM | POA: Diagnosis not present

## 2016-10-31 DIAGNOSIS — G309 Alzheimer's disease, unspecified: Secondary | ICD-10-CM | POA: Diagnosis not present

## 2016-10-31 DIAGNOSIS — R634 Abnormal weight loss: Secondary | ICD-10-CM | POA: Diagnosis not present

## 2016-10-31 DIAGNOSIS — F339 Major depressive disorder, recurrent, unspecified: Secondary | ICD-10-CM | POA: Diagnosis not present

## 2016-11-05 DIAGNOSIS — F0281 Dementia in other diseases classified elsewhere with behavioral disturbance: Secondary | ICD-10-CM | POA: Diagnosis not present

## 2016-11-05 DIAGNOSIS — F339 Major depressive disorder, recurrent, unspecified: Secondary | ICD-10-CM | POA: Diagnosis not present

## 2016-11-05 DIAGNOSIS — F411 Generalized anxiety disorder: Secondary | ICD-10-CM | POA: Diagnosis not present

## 2016-11-05 DIAGNOSIS — G309 Alzheimer's disease, unspecified: Secondary | ICD-10-CM | POA: Diagnosis not present

## 2016-11-05 DIAGNOSIS — R634 Abnormal weight loss: Secondary | ICD-10-CM | POA: Diagnosis not present

## 2016-11-05 DIAGNOSIS — E46 Unspecified protein-calorie malnutrition: Secondary | ICD-10-CM | POA: Diagnosis not present

## 2016-11-07 DIAGNOSIS — F339 Major depressive disorder, recurrent, unspecified: Secondary | ICD-10-CM | POA: Diagnosis not present

## 2016-11-07 DIAGNOSIS — R634 Abnormal weight loss: Secondary | ICD-10-CM | POA: Diagnosis not present

## 2016-11-07 DIAGNOSIS — F411 Generalized anxiety disorder: Secondary | ICD-10-CM | POA: Diagnosis not present

## 2016-11-07 DIAGNOSIS — F0281 Dementia in other diseases classified elsewhere with behavioral disturbance: Secondary | ICD-10-CM | POA: Diagnosis not present

## 2016-11-07 DIAGNOSIS — E46 Unspecified protein-calorie malnutrition: Secondary | ICD-10-CM | POA: Diagnosis not present

## 2016-11-07 DIAGNOSIS — G309 Alzheimer's disease, unspecified: Secondary | ICD-10-CM | POA: Diagnosis not present

## 2016-11-08 DIAGNOSIS — F0281 Dementia in other diseases classified elsewhere with behavioral disturbance: Secondary | ICD-10-CM | POA: Diagnosis not present

## 2016-11-08 DIAGNOSIS — F411 Generalized anxiety disorder: Secondary | ICD-10-CM | POA: Diagnosis not present

## 2016-11-08 DIAGNOSIS — E46 Unspecified protein-calorie malnutrition: Secondary | ICD-10-CM | POA: Diagnosis not present

## 2016-11-08 DIAGNOSIS — R634 Abnormal weight loss: Secondary | ICD-10-CM | POA: Diagnosis not present

## 2016-11-08 DIAGNOSIS — F339 Major depressive disorder, recurrent, unspecified: Secondary | ICD-10-CM | POA: Diagnosis not present

## 2016-11-08 DIAGNOSIS — G309 Alzheimer's disease, unspecified: Secondary | ICD-10-CM | POA: Diagnosis not present

## 2016-11-12 DIAGNOSIS — F0281 Dementia in other diseases classified elsewhere with behavioral disturbance: Secondary | ICD-10-CM | POA: Diagnosis not present

## 2016-11-12 DIAGNOSIS — R634 Abnormal weight loss: Secondary | ICD-10-CM | POA: Diagnosis not present

## 2016-11-12 DIAGNOSIS — F411 Generalized anxiety disorder: Secondary | ICD-10-CM | POA: Diagnosis not present

## 2016-11-12 DIAGNOSIS — G309 Alzheimer's disease, unspecified: Secondary | ICD-10-CM | POA: Diagnosis not present

## 2016-11-12 DIAGNOSIS — F339 Major depressive disorder, recurrent, unspecified: Secondary | ICD-10-CM | POA: Diagnosis not present

## 2016-11-12 DIAGNOSIS — E46 Unspecified protein-calorie malnutrition: Secondary | ICD-10-CM | POA: Diagnosis not present

## 2016-11-13 DIAGNOSIS — F339 Major depressive disorder, recurrent, unspecified: Secondary | ICD-10-CM | POA: Diagnosis not present

## 2016-11-13 DIAGNOSIS — E46 Unspecified protein-calorie malnutrition: Secondary | ICD-10-CM | POA: Diagnosis not present

## 2016-11-13 DIAGNOSIS — G309 Alzheimer's disease, unspecified: Secondary | ICD-10-CM | POA: Diagnosis not present

## 2016-11-13 DIAGNOSIS — F411 Generalized anxiety disorder: Secondary | ICD-10-CM | POA: Diagnosis not present

## 2016-11-13 DIAGNOSIS — R634 Abnormal weight loss: Secondary | ICD-10-CM | POA: Diagnosis not present

## 2016-11-13 DIAGNOSIS — F0281 Dementia in other diseases classified elsewhere with behavioral disturbance: Secondary | ICD-10-CM | POA: Diagnosis not present

## 2016-11-14 DIAGNOSIS — F411 Generalized anxiety disorder: Secondary | ICD-10-CM | POA: Diagnosis not present

## 2016-11-14 DIAGNOSIS — E46 Unspecified protein-calorie malnutrition: Secondary | ICD-10-CM | POA: Diagnosis not present

## 2016-11-14 DIAGNOSIS — G309 Alzheimer's disease, unspecified: Secondary | ICD-10-CM | POA: Diagnosis not present

## 2016-11-14 DIAGNOSIS — F0281 Dementia in other diseases classified elsewhere with behavioral disturbance: Secondary | ICD-10-CM | POA: Diagnosis not present

## 2016-11-14 DIAGNOSIS — F339 Major depressive disorder, recurrent, unspecified: Secondary | ICD-10-CM | POA: Diagnosis not present

## 2016-11-14 DIAGNOSIS — R634 Abnormal weight loss: Secondary | ICD-10-CM | POA: Diagnosis not present

## 2016-11-15 DIAGNOSIS — F411 Generalized anxiety disorder: Secondary | ICD-10-CM | POA: Diagnosis not present

## 2016-11-15 DIAGNOSIS — G309 Alzheimer's disease, unspecified: Secondary | ICD-10-CM | POA: Diagnosis not present

## 2016-11-15 DIAGNOSIS — E46 Unspecified protein-calorie malnutrition: Secondary | ICD-10-CM | POA: Diagnosis not present

## 2016-11-15 DIAGNOSIS — F339 Major depressive disorder, recurrent, unspecified: Secondary | ICD-10-CM | POA: Diagnosis not present

## 2016-11-15 DIAGNOSIS — F0281 Dementia in other diseases classified elsewhere with behavioral disturbance: Secondary | ICD-10-CM | POA: Diagnosis not present

## 2016-11-15 DIAGNOSIS — R634 Abnormal weight loss: Secondary | ICD-10-CM | POA: Diagnosis not present

## 2016-11-17 DIAGNOSIS — F339 Major depressive disorder, recurrent, unspecified: Secondary | ICD-10-CM | POA: Diagnosis not present

## 2016-11-17 DIAGNOSIS — G309 Alzheimer's disease, unspecified: Secondary | ICD-10-CM | POA: Diagnosis not present

## 2016-11-17 DIAGNOSIS — F0281 Dementia in other diseases classified elsewhere with behavioral disturbance: Secondary | ICD-10-CM | POA: Diagnosis not present

## 2016-11-17 DIAGNOSIS — F411 Generalized anxiety disorder: Secondary | ICD-10-CM | POA: Diagnosis not present

## 2016-11-17 DIAGNOSIS — E46 Unspecified protein-calorie malnutrition: Secondary | ICD-10-CM | POA: Diagnosis not present

## 2016-11-17 DIAGNOSIS — R634 Abnormal weight loss: Secondary | ICD-10-CM | POA: Diagnosis not present

## 2016-11-18 DIAGNOSIS — R634 Abnormal weight loss: Secondary | ICD-10-CM | POA: Diagnosis not present

## 2016-11-18 DIAGNOSIS — E46 Unspecified protein-calorie malnutrition: Secondary | ICD-10-CM | POA: Diagnosis not present

## 2016-11-18 DIAGNOSIS — F411 Generalized anxiety disorder: Secondary | ICD-10-CM | POA: Diagnosis not present

## 2016-11-18 DIAGNOSIS — F339 Major depressive disorder, recurrent, unspecified: Secondary | ICD-10-CM | POA: Diagnosis not present

## 2016-11-18 DIAGNOSIS — F0281 Dementia in other diseases classified elsewhere with behavioral disturbance: Secondary | ICD-10-CM | POA: Diagnosis not present

## 2016-11-18 DIAGNOSIS — G309 Alzheimer's disease, unspecified: Secondary | ICD-10-CM | POA: Diagnosis not present

## 2016-11-19 DIAGNOSIS — R634 Abnormal weight loss: Secondary | ICD-10-CM | POA: Diagnosis not present

## 2016-11-19 DIAGNOSIS — F411 Generalized anxiety disorder: Secondary | ICD-10-CM | POA: Diagnosis not present

## 2016-11-19 DIAGNOSIS — F0281 Dementia in other diseases classified elsewhere with behavioral disturbance: Secondary | ICD-10-CM | POA: Diagnosis not present

## 2016-11-19 DIAGNOSIS — G309 Alzheimer's disease, unspecified: Secondary | ICD-10-CM | POA: Diagnosis not present

## 2016-11-19 DIAGNOSIS — F339 Major depressive disorder, recurrent, unspecified: Secondary | ICD-10-CM | POA: Diagnosis not present

## 2016-11-19 DIAGNOSIS — E46 Unspecified protein-calorie malnutrition: Secondary | ICD-10-CM | POA: Diagnosis not present

## 2016-11-20 DIAGNOSIS — F0281 Dementia in other diseases classified elsewhere with behavioral disturbance: Secondary | ICD-10-CM | POA: Diagnosis not present

## 2016-11-20 DIAGNOSIS — F411 Generalized anxiety disorder: Secondary | ICD-10-CM | POA: Diagnosis not present

## 2016-11-20 DIAGNOSIS — E46 Unspecified protein-calorie malnutrition: Secondary | ICD-10-CM | POA: Diagnosis not present

## 2016-11-20 DIAGNOSIS — F339 Major depressive disorder, recurrent, unspecified: Secondary | ICD-10-CM | POA: Diagnosis not present

## 2016-11-20 DIAGNOSIS — R634 Abnormal weight loss: Secondary | ICD-10-CM | POA: Diagnosis not present

## 2016-11-20 DIAGNOSIS — G309 Alzheimer's disease, unspecified: Secondary | ICD-10-CM | POA: Diagnosis not present

## 2016-11-21 DIAGNOSIS — F411 Generalized anxiety disorder: Secondary | ICD-10-CM | POA: Diagnosis not present

## 2016-11-21 DIAGNOSIS — F339 Major depressive disorder, recurrent, unspecified: Secondary | ICD-10-CM | POA: Diagnosis not present

## 2016-11-21 DIAGNOSIS — R634 Abnormal weight loss: Secondary | ICD-10-CM | POA: Diagnosis not present

## 2016-11-21 DIAGNOSIS — F0281 Dementia in other diseases classified elsewhere with behavioral disturbance: Secondary | ICD-10-CM | POA: Diagnosis not present

## 2016-11-21 DIAGNOSIS — E46 Unspecified protein-calorie malnutrition: Secondary | ICD-10-CM | POA: Diagnosis not present

## 2016-11-21 DIAGNOSIS — G309 Alzheimer's disease, unspecified: Secondary | ICD-10-CM | POA: Diagnosis not present

## 2016-11-22 DIAGNOSIS — F0281 Dementia in other diseases classified elsewhere with behavioral disturbance: Secondary | ICD-10-CM | POA: Diagnosis not present

## 2016-11-22 DIAGNOSIS — G309 Alzheimer's disease, unspecified: Secondary | ICD-10-CM | POA: Diagnosis not present

## 2016-11-22 DIAGNOSIS — R634 Abnormal weight loss: Secondary | ICD-10-CM | POA: Diagnosis not present

## 2016-11-22 DIAGNOSIS — F411 Generalized anxiety disorder: Secondary | ICD-10-CM | POA: Diagnosis not present

## 2016-11-22 DIAGNOSIS — F339 Major depressive disorder, recurrent, unspecified: Secondary | ICD-10-CM | POA: Diagnosis not present

## 2016-11-22 DIAGNOSIS — E46 Unspecified protein-calorie malnutrition: Secondary | ICD-10-CM | POA: Diagnosis not present

## 2016-11-23 DIAGNOSIS — E46 Unspecified protein-calorie malnutrition: Secondary | ICD-10-CM | POA: Diagnosis not present

## 2016-11-23 DIAGNOSIS — F339 Major depressive disorder, recurrent, unspecified: Secondary | ICD-10-CM | POA: Diagnosis not present

## 2016-11-23 DIAGNOSIS — R634 Abnormal weight loss: Secondary | ICD-10-CM | POA: Diagnosis not present

## 2016-11-23 DIAGNOSIS — F0281 Dementia in other diseases classified elsewhere with behavioral disturbance: Secondary | ICD-10-CM | POA: Diagnosis not present

## 2016-11-23 DIAGNOSIS — F411 Generalized anxiety disorder: Secondary | ICD-10-CM | POA: Diagnosis not present

## 2016-11-23 DIAGNOSIS — G309 Alzheimer's disease, unspecified: Secondary | ICD-10-CM | POA: Diagnosis not present

## 2016-11-26 DIAGNOSIS — F339 Major depressive disorder, recurrent, unspecified: Secondary | ICD-10-CM | POA: Diagnosis not present

## 2016-11-26 DIAGNOSIS — G309 Alzheimer's disease, unspecified: Secondary | ICD-10-CM | POA: Diagnosis not present

## 2016-11-26 DIAGNOSIS — F411 Generalized anxiety disorder: Secondary | ICD-10-CM | POA: Diagnosis not present

## 2016-11-26 DIAGNOSIS — F0281 Dementia in other diseases classified elsewhere with behavioral disturbance: Secondary | ICD-10-CM | POA: Diagnosis not present

## 2016-11-26 DIAGNOSIS — E46 Unspecified protein-calorie malnutrition: Secondary | ICD-10-CM | POA: Diagnosis not present

## 2016-11-26 DIAGNOSIS — R634 Abnormal weight loss: Secondary | ICD-10-CM | POA: Diagnosis not present

## 2016-11-28 DIAGNOSIS — F0281 Dementia in other diseases classified elsewhere with behavioral disturbance: Secondary | ICD-10-CM | POA: Diagnosis not present

## 2016-11-28 DIAGNOSIS — F339 Major depressive disorder, recurrent, unspecified: Secondary | ICD-10-CM | POA: Diagnosis not present

## 2016-11-28 DIAGNOSIS — E46 Unspecified protein-calorie malnutrition: Secondary | ICD-10-CM | POA: Diagnosis not present

## 2016-11-28 DIAGNOSIS — R634 Abnormal weight loss: Secondary | ICD-10-CM | POA: Diagnosis not present

## 2016-11-28 DIAGNOSIS — G309 Alzheimer's disease, unspecified: Secondary | ICD-10-CM | POA: Diagnosis not present

## 2016-11-28 DIAGNOSIS — F411 Generalized anxiety disorder: Secondary | ICD-10-CM | POA: Diagnosis not present

## 2016-12-01 IMAGING — CR DG HAND COMPLETE 3+V*L*
3 series · 3 of 3 positions shown · non-contrast
Comparison: None.

CLINICAL DATA: Bilateral hand stiffness. History of Alzheimer's
disease. Initial encounter.

EXAM:
LEFT HAND - COMPLETE 3+ VIEW

[x hand pa left]
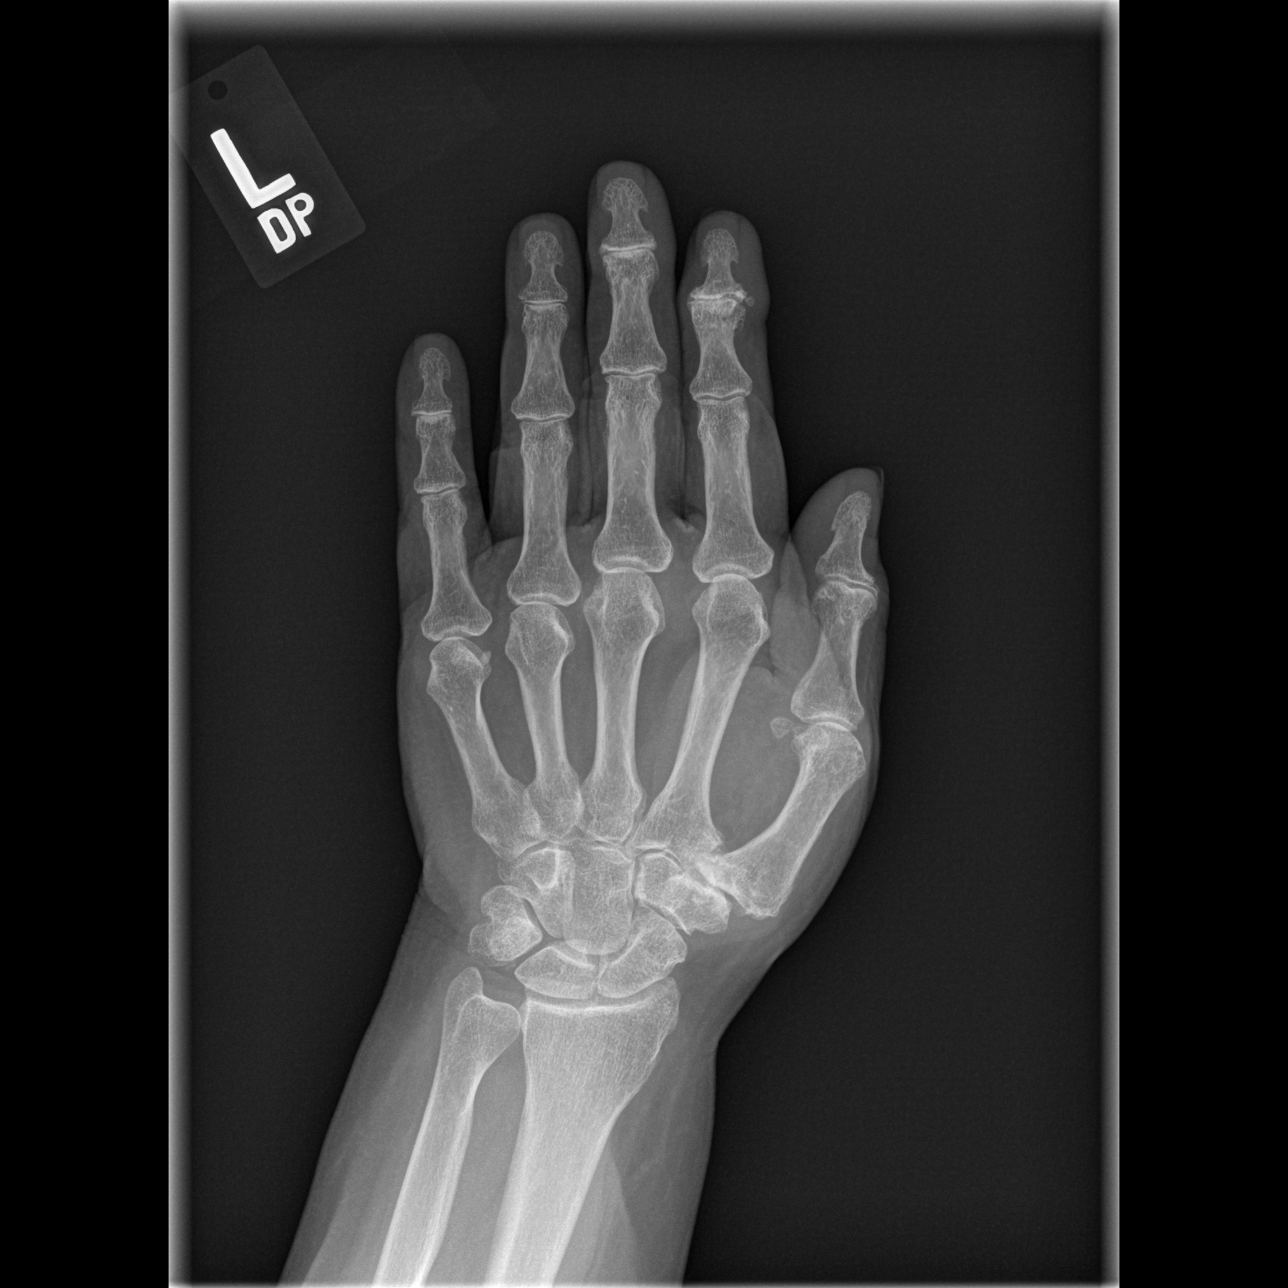

[x hand oblique left]
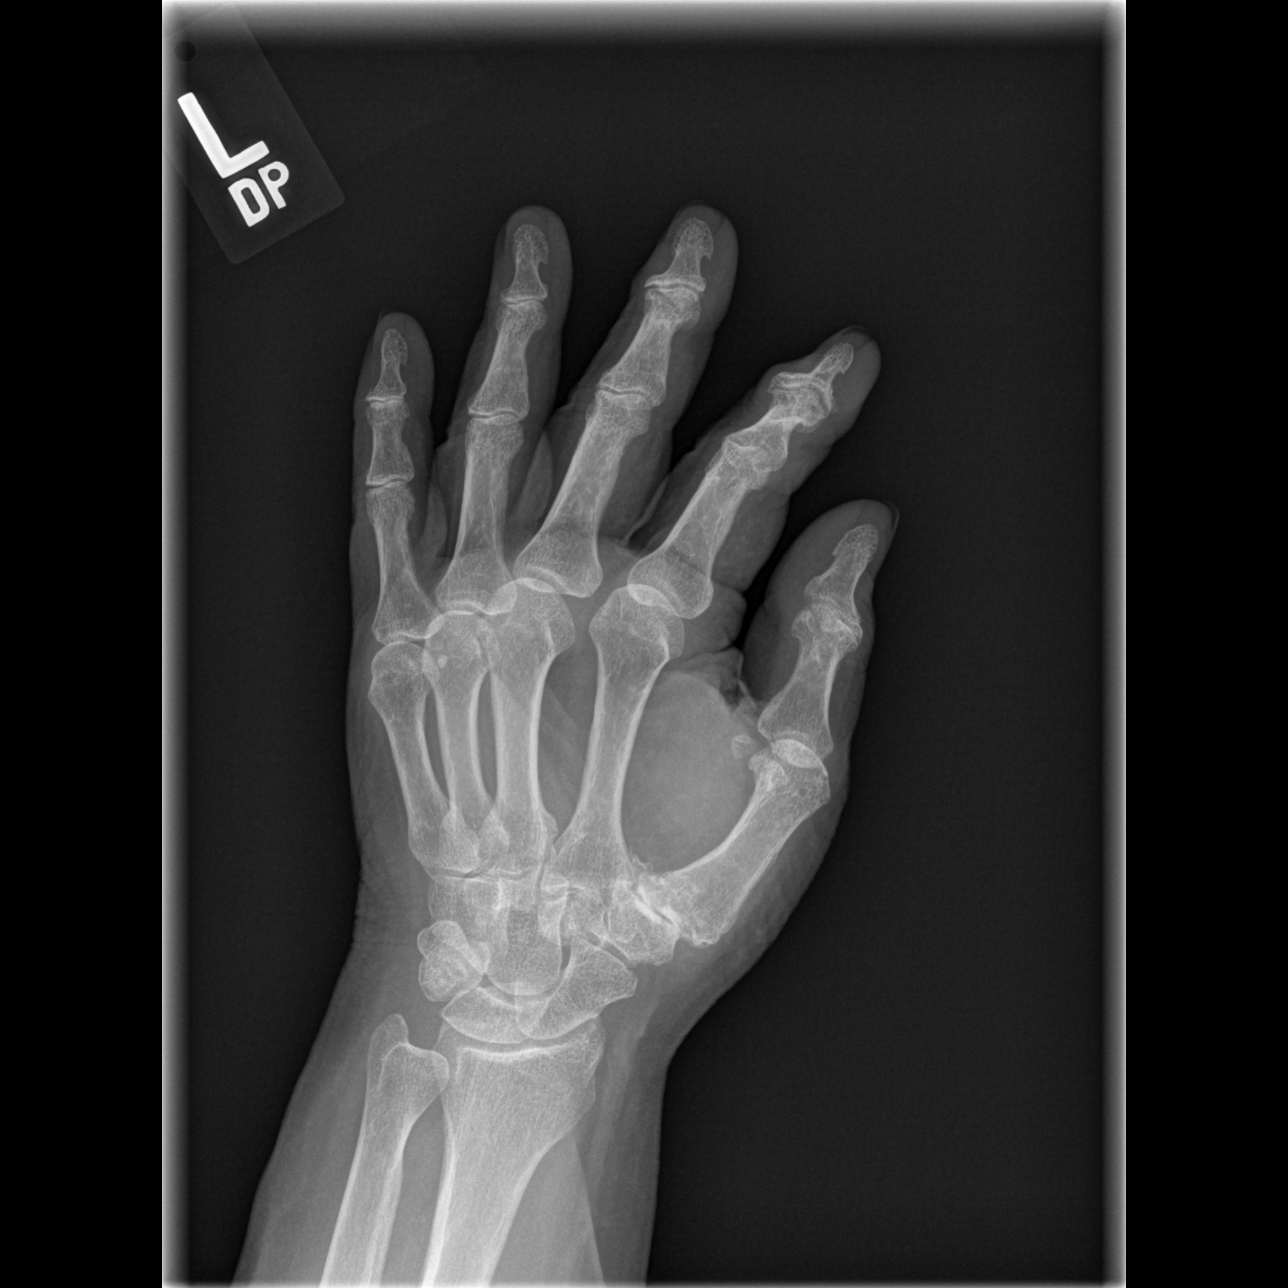

[x hand lat left]
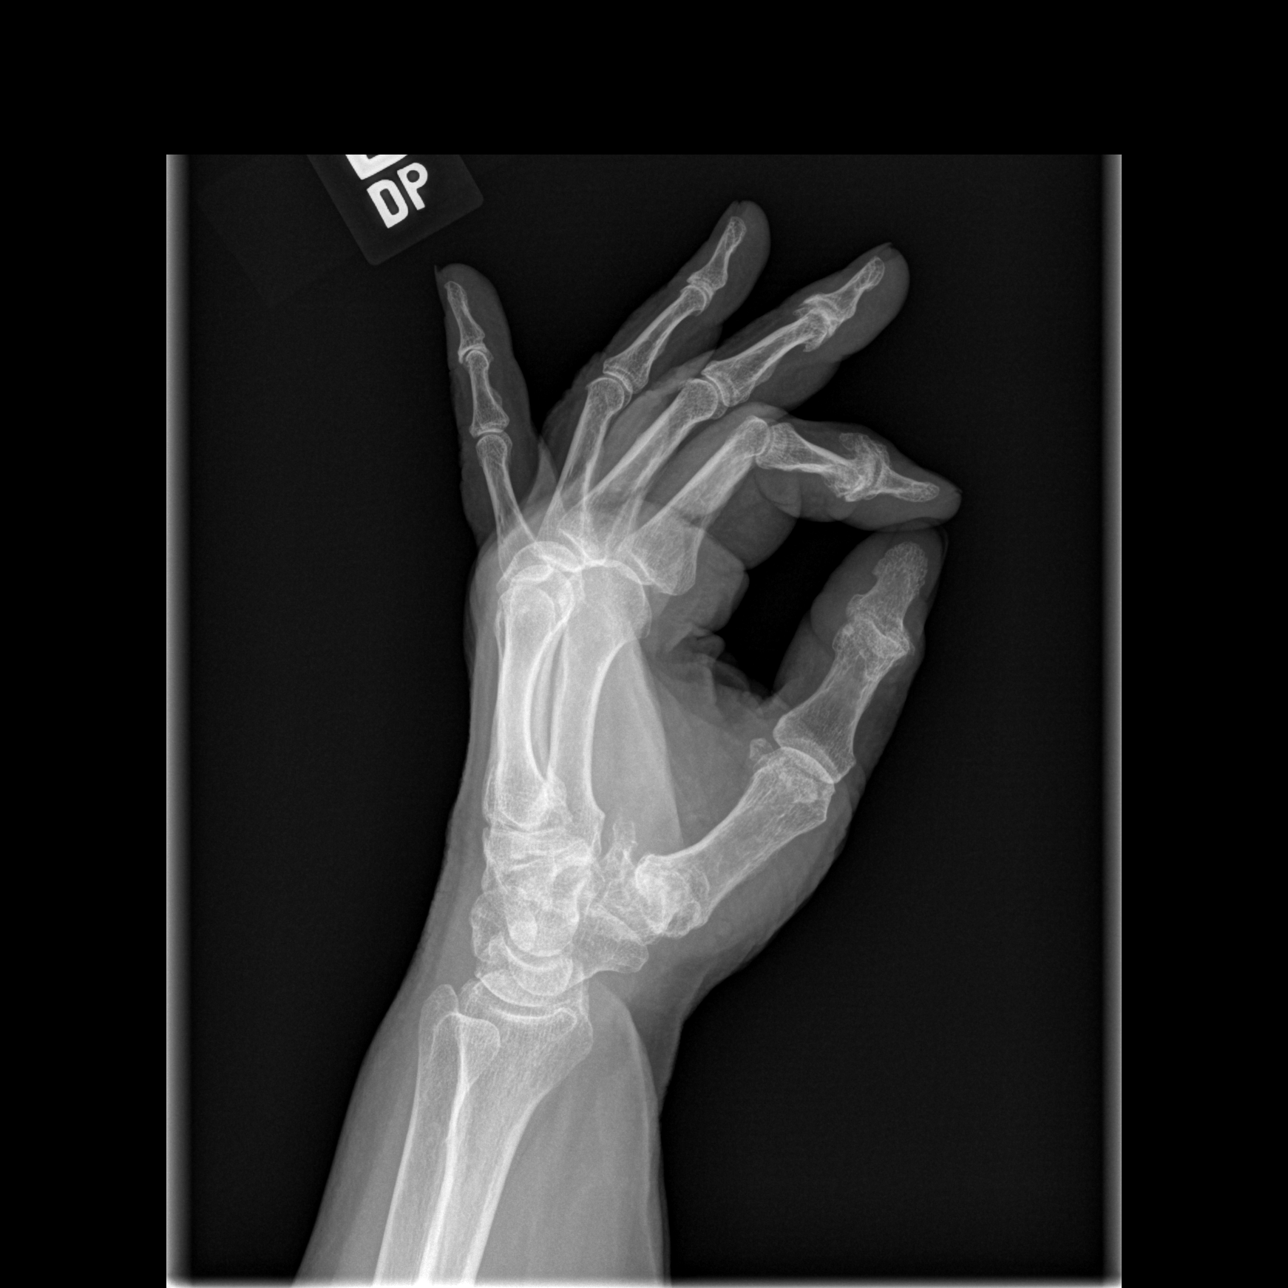

[3 of 3 positions shown; findings below may reference images not displayed]

FINDINGS: The mineralization and alignment are normal. There is no evidence of
acute fracture or dislocation. There are moderate osteoarthritic
changes, greatest at the DIP joint of the index finger and at the
first CMC joint. No erosive changes or focal soft tissue
abnormalities identified.
IMPRESSION: Osteoarthritis as described.  No acute osseous findings.

## 2016-12-01 IMAGING — CR DG HAND COMPLETE 3+V*R*
3 series · 3 of 3 positions shown · non-contrast
Comparison: None.

CLINICAL DATA: Stiffness in both hands.

EXAM:
RIGHT HAND - COMPLETE 3+ VIEW

[x hand pa right]
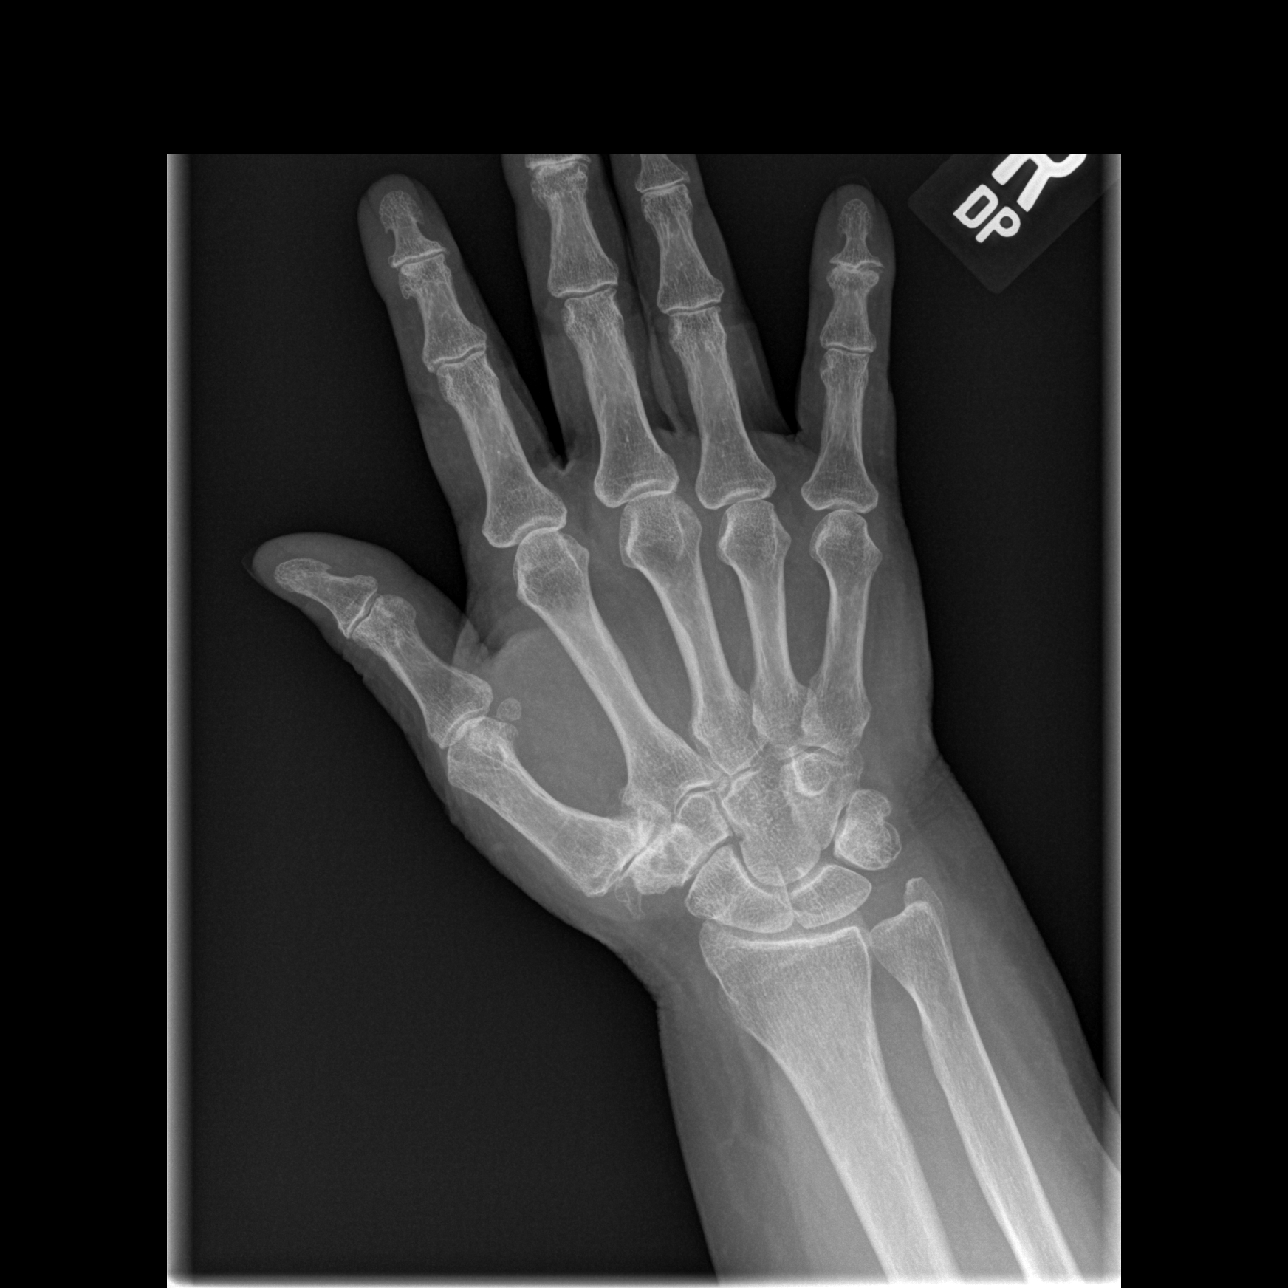

[x hand oblique right]
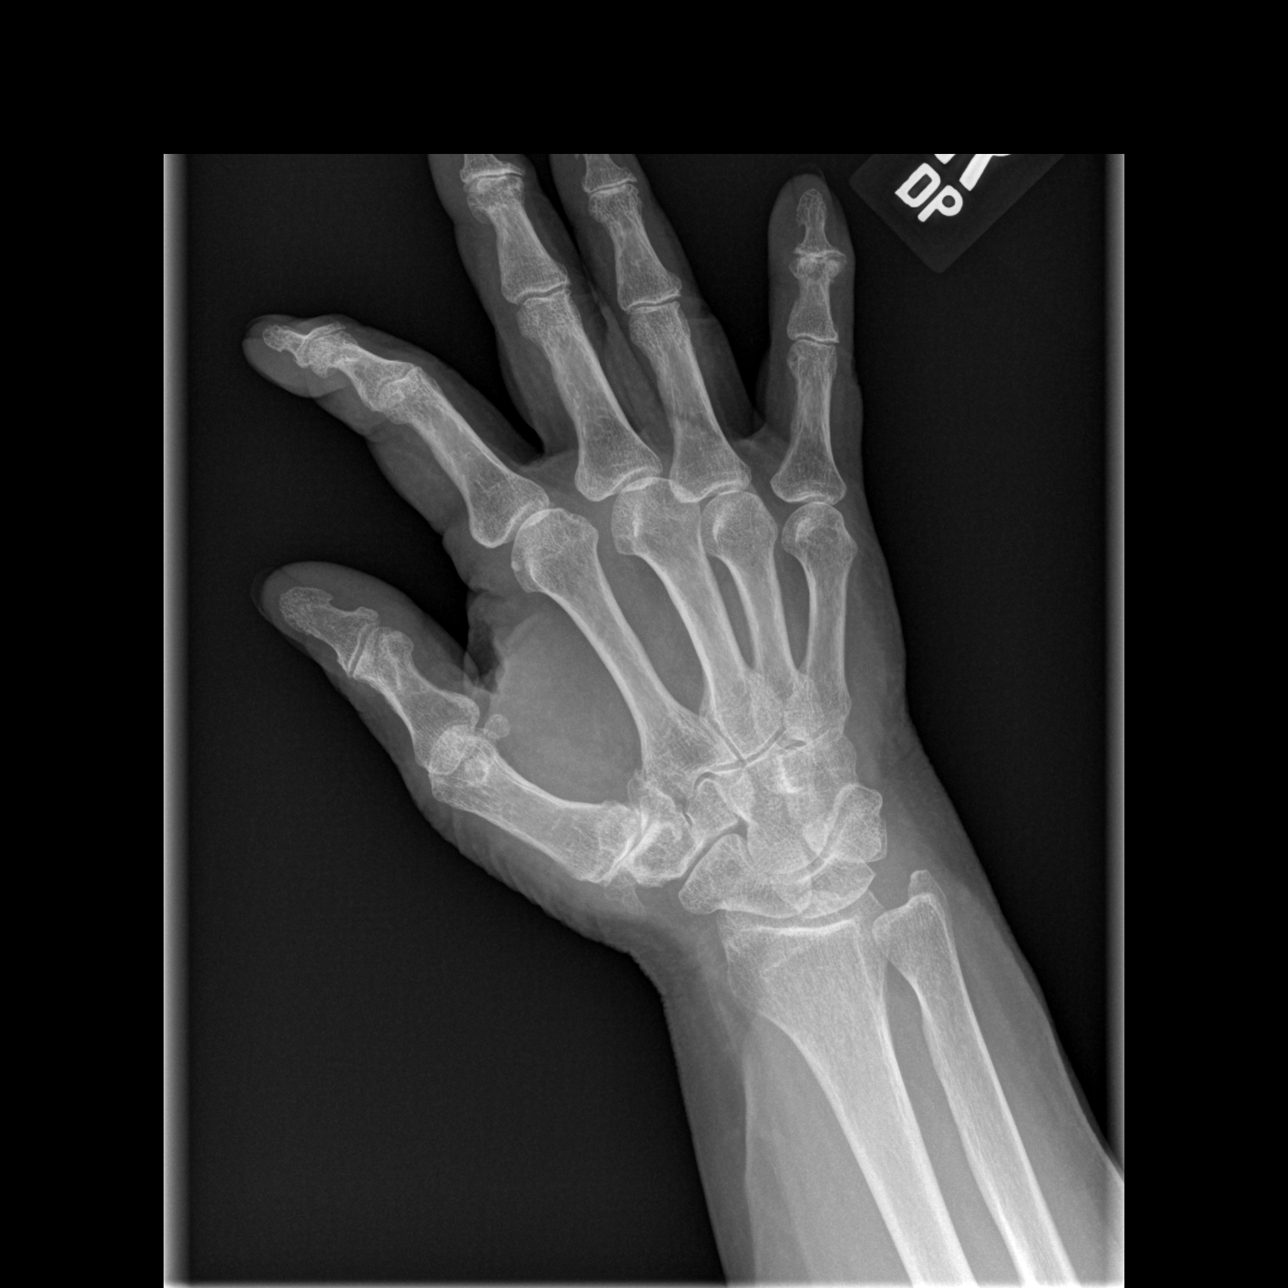

[x hand lat right]
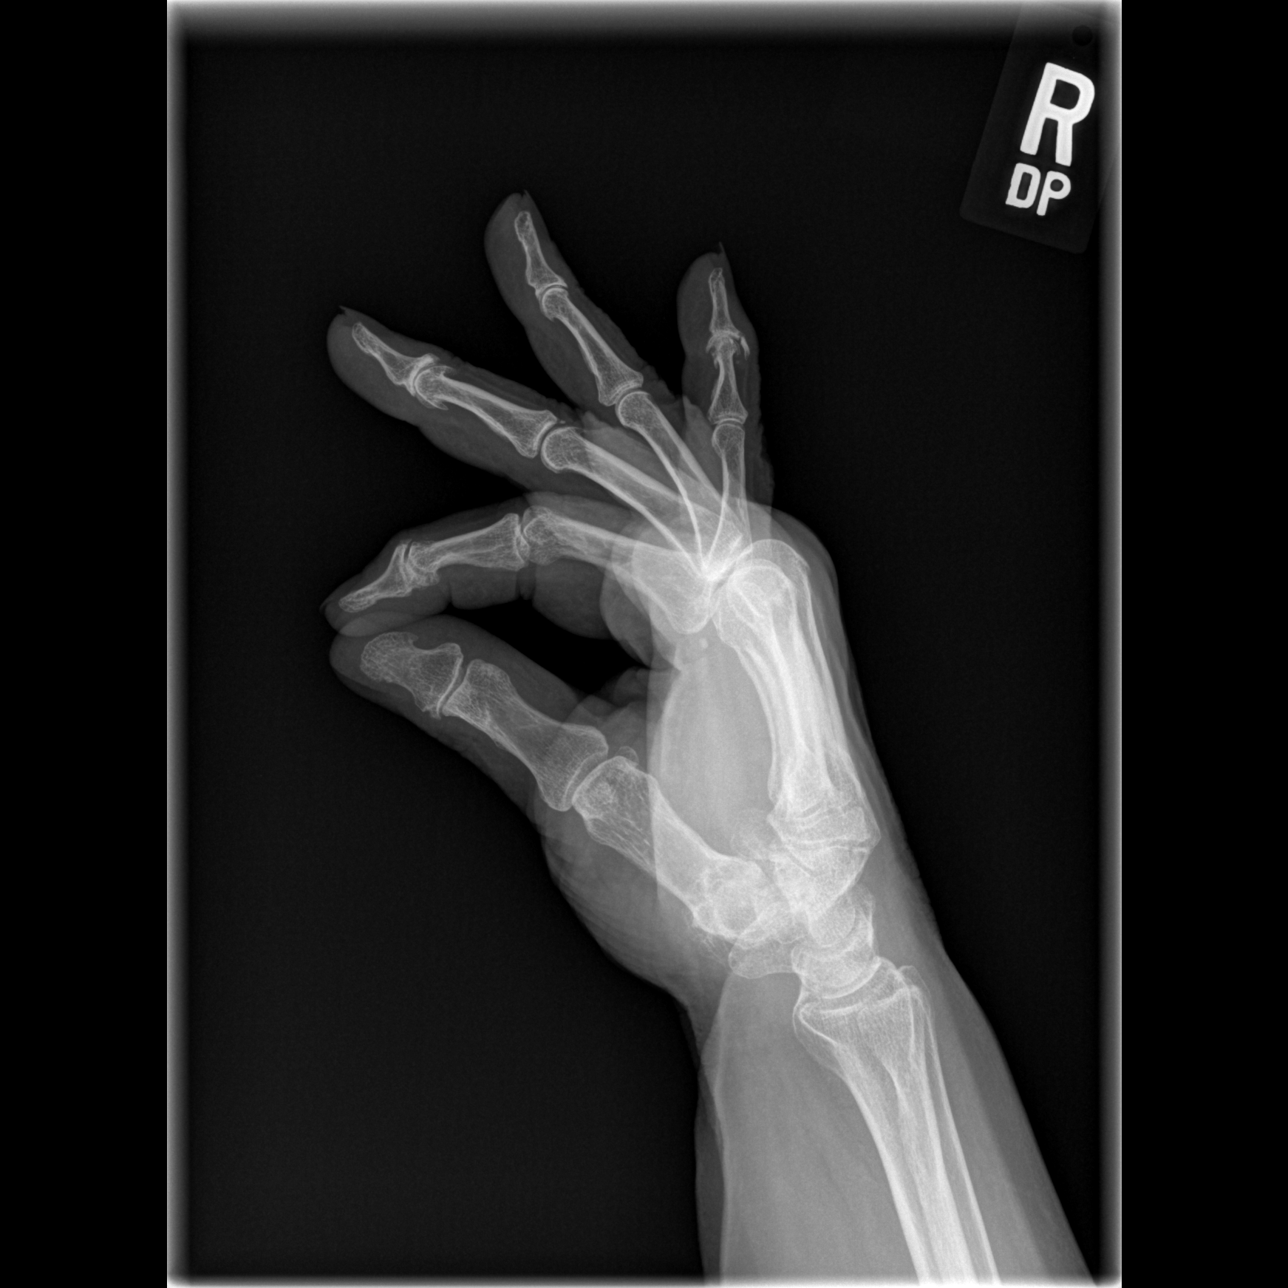

[3 of 3 positions shown; findings below may reference images not displayed]

FINDINGS: Diffuse degenerative change right hand. Degenerative changes most
prominent at the first carpometacarpal joint. No acute bony or joint
abnormality identified. No evidence of fracture or dislocation .
IMPRESSION: No acute bony or joint abnormality. No evidence of fracture or
dislocation.

## 2016-12-03 DIAGNOSIS — F411 Generalized anxiety disorder: Secondary | ICD-10-CM | POA: Diagnosis not present

## 2016-12-03 DIAGNOSIS — R634 Abnormal weight loss: Secondary | ICD-10-CM | POA: Diagnosis not present

## 2016-12-03 DIAGNOSIS — G309 Alzheimer's disease, unspecified: Secondary | ICD-10-CM | POA: Diagnosis not present

## 2016-12-03 DIAGNOSIS — F0281 Dementia in other diseases classified elsewhere with behavioral disturbance: Secondary | ICD-10-CM | POA: Diagnosis not present

## 2016-12-03 DIAGNOSIS — F339 Major depressive disorder, recurrent, unspecified: Secondary | ICD-10-CM | POA: Diagnosis not present

## 2016-12-03 DIAGNOSIS — E46 Unspecified protein-calorie malnutrition: Secondary | ICD-10-CM | POA: Diagnosis not present

## 2016-12-04 DIAGNOSIS — G309 Alzheimer's disease, unspecified: Secondary | ICD-10-CM | POA: Diagnosis not present

## 2016-12-04 DIAGNOSIS — F339 Major depressive disorder, recurrent, unspecified: Secondary | ICD-10-CM | POA: Diagnosis not present

## 2016-12-04 DIAGNOSIS — R634 Abnormal weight loss: Secondary | ICD-10-CM | POA: Diagnosis not present

## 2016-12-04 DIAGNOSIS — E46 Unspecified protein-calorie malnutrition: Secondary | ICD-10-CM | POA: Diagnosis not present

## 2016-12-04 DIAGNOSIS — F0281 Dementia in other diseases classified elsewhere with behavioral disturbance: Secondary | ICD-10-CM | POA: Diagnosis not present

## 2016-12-04 DIAGNOSIS — F411 Generalized anxiety disorder: Secondary | ICD-10-CM | POA: Diagnosis not present

## 2016-12-05 DIAGNOSIS — F339 Major depressive disorder, recurrent, unspecified: Secondary | ICD-10-CM | POA: Diagnosis not present

## 2016-12-05 DIAGNOSIS — R634 Abnormal weight loss: Secondary | ICD-10-CM | POA: Diagnosis not present

## 2016-12-05 DIAGNOSIS — F0281 Dementia in other diseases classified elsewhere with behavioral disturbance: Secondary | ICD-10-CM | POA: Diagnosis not present

## 2016-12-05 DIAGNOSIS — E46 Unspecified protein-calorie malnutrition: Secondary | ICD-10-CM | POA: Diagnosis not present

## 2016-12-05 DIAGNOSIS — G309 Alzheimer's disease, unspecified: Secondary | ICD-10-CM | POA: Diagnosis not present

## 2016-12-05 DIAGNOSIS — F411 Generalized anxiety disorder: Secondary | ICD-10-CM | POA: Diagnosis not present

## 2016-12-08 DIAGNOSIS — R634 Abnormal weight loss: Secondary | ICD-10-CM | POA: Diagnosis not present

## 2016-12-08 DIAGNOSIS — F0281 Dementia in other diseases classified elsewhere with behavioral disturbance: Secondary | ICD-10-CM | POA: Diagnosis not present

## 2016-12-08 DIAGNOSIS — F339 Major depressive disorder, recurrent, unspecified: Secondary | ICD-10-CM | POA: Diagnosis not present

## 2016-12-08 DIAGNOSIS — G309 Alzheimer's disease, unspecified: Secondary | ICD-10-CM | POA: Diagnosis not present

## 2016-12-08 DIAGNOSIS — F411 Generalized anxiety disorder: Secondary | ICD-10-CM | POA: Diagnosis not present

## 2016-12-08 DIAGNOSIS — E46 Unspecified protein-calorie malnutrition: Secondary | ICD-10-CM | POA: Diagnosis not present

## 2016-12-10 DIAGNOSIS — R634 Abnormal weight loss: Secondary | ICD-10-CM | POA: Diagnosis not present

## 2016-12-10 DIAGNOSIS — F339 Major depressive disorder, recurrent, unspecified: Secondary | ICD-10-CM | POA: Diagnosis not present

## 2016-12-10 DIAGNOSIS — G309 Alzheimer's disease, unspecified: Secondary | ICD-10-CM | POA: Diagnosis not present

## 2016-12-10 DIAGNOSIS — F0281 Dementia in other diseases classified elsewhere with behavioral disturbance: Secondary | ICD-10-CM | POA: Diagnosis not present

## 2016-12-10 DIAGNOSIS — E46 Unspecified protein-calorie malnutrition: Secondary | ICD-10-CM | POA: Diagnosis not present

## 2016-12-10 DIAGNOSIS — F411 Generalized anxiety disorder: Secondary | ICD-10-CM | POA: Diagnosis not present

## 2016-12-12 DIAGNOSIS — F411 Generalized anxiety disorder: Secondary | ICD-10-CM | POA: Diagnosis not present

## 2016-12-12 DIAGNOSIS — G309 Alzheimer's disease, unspecified: Secondary | ICD-10-CM | POA: Diagnosis not present

## 2016-12-12 DIAGNOSIS — R634 Abnormal weight loss: Secondary | ICD-10-CM | POA: Diagnosis not present

## 2016-12-12 DIAGNOSIS — F339 Major depressive disorder, recurrent, unspecified: Secondary | ICD-10-CM | POA: Diagnosis not present

## 2016-12-12 DIAGNOSIS — E46 Unspecified protein-calorie malnutrition: Secondary | ICD-10-CM | POA: Diagnosis not present

## 2016-12-12 DIAGNOSIS — F0281 Dementia in other diseases classified elsewhere with behavioral disturbance: Secondary | ICD-10-CM | POA: Diagnosis not present

## 2016-12-14 DIAGNOSIS — F339 Major depressive disorder, recurrent, unspecified: Secondary | ICD-10-CM | POA: Diagnosis not present

## 2016-12-14 DIAGNOSIS — F0281 Dementia in other diseases classified elsewhere with behavioral disturbance: Secondary | ICD-10-CM | POA: Diagnosis not present

## 2016-12-14 DIAGNOSIS — F411 Generalized anxiety disorder: Secondary | ICD-10-CM | POA: Diagnosis not present

## 2016-12-14 DIAGNOSIS — G309 Alzheimer's disease, unspecified: Secondary | ICD-10-CM | POA: Diagnosis not present

## 2016-12-14 DIAGNOSIS — E46 Unspecified protein-calorie malnutrition: Secondary | ICD-10-CM | POA: Diagnosis not present

## 2016-12-14 DIAGNOSIS — R634 Abnormal weight loss: Secondary | ICD-10-CM | POA: Diagnosis not present

## 2016-12-16 DIAGNOSIS — E46 Unspecified protein-calorie malnutrition: Secondary | ICD-10-CM | POA: Diagnosis not present

## 2016-12-16 DIAGNOSIS — F339 Major depressive disorder, recurrent, unspecified: Secondary | ICD-10-CM | POA: Diagnosis not present

## 2016-12-16 DIAGNOSIS — G309 Alzheimer's disease, unspecified: Secondary | ICD-10-CM | POA: Diagnosis not present

## 2016-12-16 DIAGNOSIS — R634 Abnormal weight loss: Secondary | ICD-10-CM | POA: Diagnosis not present

## 2016-12-16 DIAGNOSIS — F411 Generalized anxiety disorder: Secondary | ICD-10-CM | POA: Diagnosis not present

## 2016-12-16 DIAGNOSIS — F0281 Dementia in other diseases classified elsewhere with behavioral disturbance: Secondary | ICD-10-CM | POA: Diagnosis not present

## 2016-12-17 DIAGNOSIS — F0281 Dementia in other diseases classified elsewhere with behavioral disturbance: Secondary | ICD-10-CM | POA: Diagnosis not present

## 2016-12-17 DIAGNOSIS — R634 Abnormal weight loss: Secondary | ICD-10-CM | POA: Diagnosis not present

## 2016-12-17 DIAGNOSIS — F339 Major depressive disorder, recurrent, unspecified: Secondary | ICD-10-CM | POA: Diagnosis not present

## 2016-12-17 DIAGNOSIS — E46 Unspecified protein-calorie malnutrition: Secondary | ICD-10-CM | POA: Diagnosis not present

## 2016-12-17 DIAGNOSIS — F411 Generalized anxiety disorder: Secondary | ICD-10-CM | POA: Diagnosis not present

## 2016-12-17 DIAGNOSIS — G309 Alzheimer's disease, unspecified: Secondary | ICD-10-CM | POA: Diagnosis not present

## 2016-12-18 DIAGNOSIS — E46 Unspecified protein-calorie malnutrition: Secondary | ICD-10-CM | POA: Diagnosis not present

## 2016-12-18 DIAGNOSIS — F411 Generalized anxiety disorder: Secondary | ICD-10-CM | POA: Diagnosis not present

## 2016-12-18 DIAGNOSIS — R634 Abnormal weight loss: Secondary | ICD-10-CM | POA: Diagnosis not present

## 2016-12-18 DIAGNOSIS — F339 Major depressive disorder, recurrent, unspecified: Secondary | ICD-10-CM | POA: Diagnosis not present

## 2016-12-18 DIAGNOSIS — G309 Alzheimer's disease, unspecified: Secondary | ICD-10-CM | POA: Diagnosis not present

## 2016-12-18 DIAGNOSIS — F0281 Dementia in other diseases classified elsewhere with behavioral disturbance: Secondary | ICD-10-CM | POA: Diagnosis not present

## 2016-12-19 DIAGNOSIS — R634 Abnormal weight loss: Secondary | ICD-10-CM | POA: Diagnosis not present

## 2016-12-19 DIAGNOSIS — G309 Alzheimer's disease, unspecified: Secondary | ICD-10-CM | POA: Diagnosis not present

## 2016-12-19 DIAGNOSIS — F411 Generalized anxiety disorder: Secondary | ICD-10-CM | POA: Diagnosis not present

## 2016-12-19 DIAGNOSIS — F339 Major depressive disorder, recurrent, unspecified: Secondary | ICD-10-CM | POA: Diagnosis not present

## 2016-12-19 DIAGNOSIS — F0281 Dementia in other diseases classified elsewhere with behavioral disturbance: Secondary | ICD-10-CM | POA: Diagnosis not present

## 2016-12-19 DIAGNOSIS — E46 Unspecified protein-calorie malnutrition: Secondary | ICD-10-CM | POA: Diagnosis not present

## 2016-12-24 DIAGNOSIS — F339 Major depressive disorder, recurrent, unspecified: Secondary | ICD-10-CM | POA: Diagnosis not present

## 2016-12-24 DIAGNOSIS — F411 Generalized anxiety disorder: Secondary | ICD-10-CM | POA: Diagnosis not present

## 2016-12-24 DIAGNOSIS — R634 Abnormal weight loss: Secondary | ICD-10-CM | POA: Diagnosis not present

## 2016-12-24 DIAGNOSIS — F0281 Dementia in other diseases classified elsewhere with behavioral disturbance: Secondary | ICD-10-CM | POA: Diagnosis not present

## 2016-12-24 DIAGNOSIS — E46 Unspecified protein-calorie malnutrition: Secondary | ICD-10-CM | POA: Diagnosis not present

## 2016-12-24 DIAGNOSIS — G309 Alzheimer's disease, unspecified: Secondary | ICD-10-CM | POA: Diagnosis not present

## 2016-12-25 DIAGNOSIS — E46 Unspecified protein-calorie malnutrition: Secondary | ICD-10-CM | POA: Diagnosis not present

## 2016-12-25 DIAGNOSIS — F339 Major depressive disorder, recurrent, unspecified: Secondary | ICD-10-CM | POA: Diagnosis not present

## 2016-12-25 DIAGNOSIS — F411 Generalized anxiety disorder: Secondary | ICD-10-CM | POA: Diagnosis not present

## 2016-12-25 DIAGNOSIS — R634 Abnormal weight loss: Secondary | ICD-10-CM | POA: Diagnosis not present

## 2016-12-25 DIAGNOSIS — F0281 Dementia in other diseases classified elsewhere with behavioral disturbance: Secondary | ICD-10-CM | POA: Diagnosis not present

## 2016-12-25 DIAGNOSIS — G309 Alzheimer's disease, unspecified: Secondary | ICD-10-CM | POA: Diagnosis not present

## 2016-12-26 DIAGNOSIS — F0281 Dementia in other diseases classified elsewhere with behavioral disturbance: Secondary | ICD-10-CM | POA: Diagnosis not present

## 2016-12-26 DIAGNOSIS — R634 Abnormal weight loss: Secondary | ICD-10-CM | POA: Diagnosis not present

## 2016-12-26 DIAGNOSIS — F411 Generalized anxiety disorder: Secondary | ICD-10-CM | POA: Diagnosis not present

## 2016-12-26 DIAGNOSIS — F339 Major depressive disorder, recurrent, unspecified: Secondary | ICD-10-CM | POA: Diagnosis not present

## 2016-12-26 DIAGNOSIS — G309 Alzheimer's disease, unspecified: Secondary | ICD-10-CM | POA: Diagnosis not present

## 2016-12-26 DIAGNOSIS — E46 Unspecified protein-calorie malnutrition: Secondary | ICD-10-CM | POA: Diagnosis not present

## 2016-12-31 DIAGNOSIS — F411 Generalized anxiety disorder: Secondary | ICD-10-CM | POA: Diagnosis not present

## 2016-12-31 DIAGNOSIS — G309 Alzheimer's disease, unspecified: Secondary | ICD-10-CM | POA: Diagnosis not present

## 2016-12-31 DIAGNOSIS — F339 Major depressive disorder, recurrent, unspecified: Secondary | ICD-10-CM | POA: Diagnosis not present

## 2016-12-31 DIAGNOSIS — E46 Unspecified protein-calorie malnutrition: Secondary | ICD-10-CM | POA: Diagnosis not present

## 2016-12-31 DIAGNOSIS — F0281 Dementia in other diseases classified elsewhere with behavioral disturbance: Secondary | ICD-10-CM | POA: Diagnosis not present

## 2016-12-31 DIAGNOSIS — R634 Abnormal weight loss: Secondary | ICD-10-CM | POA: Diagnosis not present

## 2017-01-02 DIAGNOSIS — E46 Unspecified protein-calorie malnutrition: Secondary | ICD-10-CM | POA: Diagnosis not present

## 2017-01-02 DIAGNOSIS — F411 Generalized anxiety disorder: Secondary | ICD-10-CM | POA: Diagnosis not present

## 2017-01-02 DIAGNOSIS — F339 Major depressive disorder, recurrent, unspecified: Secondary | ICD-10-CM | POA: Diagnosis not present

## 2017-01-02 DIAGNOSIS — G309 Alzheimer's disease, unspecified: Secondary | ICD-10-CM | POA: Diagnosis not present

## 2017-01-02 DIAGNOSIS — R634 Abnormal weight loss: Secondary | ICD-10-CM | POA: Diagnosis not present

## 2017-01-02 DIAGNOSIS — F0281 Dementia in other diseases classified elsewhere with behavioral disturbance: Secondary | ICD-10-CM | POA: Diagnosis not present

## 2017-01-03 DIAGNOSIS — F0281 Dementia in other diseases classified elsewhere with behavioral disturbance: Secondary | ICD-10-CM | POA: Diagnosis not present

## 2017-01-03 DIAGNOSIS — G309 Alzheimer's disease, unspecified: Secondary | ICD-10-CM | POA: Diagnosis not present

## 2017-01-03 DIAGNOSIS — F339 Major depressive disorder, recurrent, unspecified: Secondary | ICD-10-CM | POA: Diagnosis not present

## 2017-01-03 DIAGNOSIS — R634 Abnormal weight loss: Secondary | ICD-10-CM | POA: Diagnosis not present

## 2017-01-03 DIAGNOSIS — F411 Generalized anxiety disorder: Secondary | ICD-10-CM | POA: Diagnosis not present

## 2017-01-03 DIAGNOSIS — E46 Unspecified protein-calorie malnutrition: Secondary | ICD-10-CM | POA: Diagnosis not present

## 2017-01-07 DIAGNOSIS — E46 Unspecified protein-calorie malnutrition: Secondary | ICD-10-CM | POA: Diagnosis not present

## 2017-01-07 DIAGNOSIS — F0281 Dementia in other diseases classified elsewhere with behavioral disturbance: Secondary | ICD-10-CM | POA: Diagnosis not present

## 2017-01-07 DIAGNOSIS — F339 Major depressive disorder, recurrent, unspecified: Secondary | ICD-10-CM | POA: Diagnosis not present

## 2017-01-07 DIAGNOSIS — R634 Abnormal weight loss: Secondary | ICD-10-CM | POA: Diagnosis not present

## 2017-01-07 DIAGNOSIS — F411 Generalized anxiety disorder: Secondary | ICD-10-CM | POA: Diagnosis not present

## 2017-01-07 DIAGNOSIS — G309 Alzheimer's disease, unspecified: Secondary | ICD-10-CM | POA: Diagnosis not present

## 2017-01-09 DIAGNOSIS — F411 Generalized anxiety disorder: Secondary | ICD-10-CM | POA: Diagnosis not present

## 2017-01-09 DIAGNOSIS — F339 Major depressive disorder, recurrent, unspecified: Secondary | ICD-10-CM | POA: Diagnosis not present

## 2017-01-09 DIAGNOSIS — G309 Alzheimer's disease, unspecified: Secondary | ICD-10-CM | POA: Diagnosis not present

## 2017-01-09 DIAGNOSIS — R634 Abnormal weight loss: Secondary | ICD-10-CM | POA: Diagnosis not present

## 2017-01-09 DIAGNOSIS — F0281 Dementia in other diseases classified elsewhere with behavioral disturbance: Secondary | ICD-10-CM | POA: Diagnosis not present

## 2017-01-09 DIAGNOSIS — E46 Unspecified protein-calorie malnutrition: Secondary | ICD-10-CM | POA: Diagnosis not present

## 2017-01-13 DIAGNOSIS — F0281 Dementia in other diseases classified elsewhere with behavioral disturbance: Secondary | ICD-10-CM | POA: Diagnosis not present

## 2017-01-13 DIAGNOSIS — R634 Abnormal weight loss: Secondary | ICD-10-CM | POA: Diagnosis not present

## 2017-01-13 DIAGNOSIS — F339 Major depressive disorder, recurrent, unspecified: Secondary | ICD-10-CM | POA: Diagnosis not present

## 2017-01-13 DIAGNOSIS — G309 Alzheimer's disease, unspecified: Secondary | ICD-10-CM | POA: Diagnosis not present

## 2017-01-13 DIAGNOSIS — E46 Unspecified protein-calorie malnutrition: Secondary | ICD-10-CM | POA: Diagnosis not present

## 2017-01-13 DIAGNOSIS — F411 Generalized anxiety disorder: Secondary | ICD-10-CM | POA: Diagnosis not present

## 2017-01-14 DIAGNOSIS — E46 Unspecified protein-calorie malnutrition: Secondary | ICD-10-CM | POA: Diagnosis not present

## 2017-01-14 DIAGNOSIS — F0281 Dementia in other diseases classified elsewhere with behavioral disturbance: Secondary | ICD-10-CM | POA: Diagnosis not present

## 2017-01-14 DIAGNOSIS — R634 Abnormal weight loss: Secondary | ICD-10-CM | POA: Diagnosis not present

## 2017-01-14 DIAGNOSIS — F411 Generalized anxiety disorder: Secondary | ICD-10-CM | POA: Diagnosis not present

## 2017-01-14 DIAGNOSIS — G309 Alzheimer's disease, unspecified: Secondary | ICD-10-CM | POA: Diagnosis not present

## 2017-01-14 DIAGNOSIS — F339 Major depressive disorder, recurrent, unspecified: Secondary | ICD-10-CM | POA: Diagnosis not present

## 2017-01-15 DIAGNOSIS — E46 Unspecified protein-calorie malnutrition: Secondary | ICD-10-CM | POA: Diagnosis not present

## 2017-01-15 DIAGNOSIS — G309 Alzheimer's disease, unspecified: Secondary | ICD-10-CM | POA: Diagnosis not present

## 2017-01-15 DIAGNOSIS — F411 Generalized anxiety disorder: Secondary | ICD-10-CM | POA: Diagnosis not present

## 2017-01-15 DIAGNOSIS — F0281 Dementia in other diseases classified elsewhere with behavioral disturbance: Secondary | ICD-10-CM | POA: Diagnosis not present

## 2017-01-15 DIAGNOSIS — F339 Major depressive disorder, recurrent, unspecified: Secondary | ICD-10-CM | POA: Diagnosis not present

## 2017-01-15 DIAGNOSIS — R634 Abnormal weight loss: Secondary | ICD-10-CM | POA: Diagnosis not present

## 2017-01-16 DIAGNOSIS — F339 Major depressive disorder, recurrent, unspecified: Secondary | ICD-10-CM | POA: Diagnosis not present

## 2017-01-16 DIAGNOSIS — R634 Abnormal weight loss: Secondary | ICD-10-CM | POA: Diagnosis not present

## 2017-01-16 DIAGNOSIS — G309 Alzheimer's disease, unspecified: Secondary | ICD-10-CM | POA: Diagnosis not present

## 2017-01-16 DIAGNOSIS — F0281 Dementia in other diseases classified elsewhere with behavioral disturbance: Secondary | ICD-10-CM | POA: Diagnosis not present

## 2017-01-16 DIAGNOSIS — F411 Generalized anxiety disorder: Secondary | ICD-10-CM | POA: Diagnosis not present

## 2017-01-16 DIAGNOSIS — E46 Unspecified protein-calorie malnutrition: Secondary | ICD-10-CM | POA: Diagnosis not present

## 2017-01-18 DIAGNOSIS — F411 Generalized anxiety disorder: Secondary | ICD-10-CM | POA: Diagnosis not present

## 2017-01-18 DIAGNOSIS — R634 Abnormal weight loss: Secondary | ICD-10-CM | POA: Diagnosis not present

## 2017-01-18 DIAGNOSIS — F339 Major depressive disorder, recurrent, unspecified: Secondary | ICD-10-CM | POA: Diagnosis not present

## 2017-01-18 DIAGNOSIS — E46 Unspecified protein-calorie malnutrition: Secondary | ICD-10-CM | POA: Diagnosis not present

## 2017-01-18 DIAGNOSIS — G309 Alzheimer's disease, unspecified: Secondary | ICD-10-CM | POA: Diagnosis not present

## 2017-01-18 DIAGNOSIS — F0281 Dementia in other diseases classified elsewhere with behavioral disturbance: Secondary | ICD-10-CM | POA: Diagnosis not present

## 2017-01-21 DIAGNOSIS — R634 Abnormal weight loss: Secondary | ICD-10-CM | POA: Diagnosis not present

## 2017-01-21 DIAGNOSIS — F339 Major depressive disorder, recurrent, unspecified: Secondary | ICD-10-CM | POA: Diagnosis not present

## 2017-01-21 DIAGNOSIS — G309 Alzheimer's disease, unspecified: Secondary | ICD-10-CM | POA: Diagnosis not present

## 2017-01-21 DIAGNOSIS — E46 Unspecified protein-calorie malnutrition: Secondary | ICD-10-CM | POA: Diagnosis not present

## 2017-01-21 DIAGNOSIS — F411 Generalized anxiety disorder: Secondary | ICD-10-CM | POA: Diagnosis not present

## 2017-01-21 DIAGNOSIS — F0281 Dementia in other diseases classified elsewhere with behavioral disturbance: Secondary | ICD-10-CM | POA: Diagnosis not present

## 2017-01-23 DIAGNOSIS — F0281 Dementia in other diseases classified elsewhere with behavioral disturbance: Secondary | ICD-10-CM | POA: Diagnosis not present

## 2017-01-23 DIAGNOSIS — F411 Generalized anxiety disorder: Secondary | ICD-10-CM | POA: Diagnosis not present

## 2017-01-23 DIAGNOSIS — R634 Abnormal weight loss: Secondary | ICD-10-CM | POA: Diagnosis not present

## 2017-01-23 DIAGNOSIS — G309 Alzheimer's disease, unspecified: Secondary | ICD-10-CM | POA: Diagnosis not present

## 2017-01-23 DIAGNOSIS — F339 Major depressive disorder, recurrent, unspecified: Secondary | ICD-10-CM | POA: Diagnosis not present

## 2017-01-23 DIAGNOSIS — E46 Unspecified protein-calorie malnutrition: Secondary | ICD-10-CM | POA: Diagnosis not present

## 2017-01-27 DIAGNOSIS — R634 Abnormal weight loss: Secondary | ICD-10-CM | POA: Diagnosis not present

## 2017-01-27 DIAGNOSIS — F411 Generalized anxiety disorder: Secondary | ICD-10-CM | POA: Diagnosis not present

## 2017-01-27 DIAGNOSIS — F339 Major depressive disorder, recurrent, unspecified: Secondary | ICD-10-CM | POA: Diagnosis not present

## 2017-01-27 DIAGNOSIS — E46 Unspecified protein-calorie malnutrition: Secondary | ICD-10-CM | POA: Diagnosis not present

## 2017-01-27 DIAGNOSIS — G309 Alzheimer's disease, unspecified: Secondary | ICD-10-CM | POA: Diagnosis not present

## 2017-01-27 DIAGNOSIS — F0281 Dementia in other diseases classified elsewhere with behavioral disturbance: Secondary | ICD-10-CM | POA: Diagnosis not present

## 2017-01-28 DIAGNOSIS — G309 Alzheimer's disease, unspecified: Secondary | ICD-10-CM | POA: Diagnosis not present

## 2017-01-28 DIAGNOSIS — F0281 Dementia in other diseases classified elsewhere with behavioral disturbance: Secondary | ICD-10-CM | POA: Diagnosis not present

## 2017-01-28 DIAGNOSIS — F339 Major depressive disorder, recurrent, unspecified: Secondary | ICD-10-CM | POA: Diagnosis not present

## 2017-01-28 DIAGNOSIS — E46 Unspecified protein-calorie malnutrition: Secondary | ICD-10-CM | POA: Diagnosis not present

## 2017-01-28 DIAGNOSIS — R634 Abnormal weight loss: Secondary | ICD-10-CM | POA: Diagnosis not present

## 2017-01-28 DIAGNOSIS — F411 Generalized anxiety disorder: Secondary | ICD-10-CM | POA: Diagnosis not present

## 2017-01-30 DIAGNOSIS — F339 Major depressive disorder, recurrent, unspecified: Secondary | ICD-10-CM | POA: Diagnosis not present

## 2017-01-30 DIAGNOSIS — F411 Generalized anxiety disorder: Secondary | ICD-10-CM | POA: Diagnosis not present

## 2017-01-30 DIAGNOSIS — G309 Alzheimer's disease, unspecified: Secondary | ICD-10-CM | POA: Diagnosis not present

## 2017-01-30 DIAGNOSIS — F0281 Dementia in other diseases classified elsewhere with behavioral disturbance: Secondary | ICD-10-CM | POA: Diagnosis not present

## 2017-01-30 DIAGNOSIS — R634 Abnormal weight loss: Secondary | ICD-10-CM | POA: Diagnosis not present

## 2017-01-30 DIAGNOSIS — E46 Unspecified protein-calorie malnutrition: Secondary | ICD-10-CM | POA: Diagnosis not present

## 2017-02-04 DIAGNOSIS — F339 Major depressive disorder, recurrent, unspecified: Secondary | ICD-10-CM | POA: Diagnosis not present

## 2017-02-04 DIAGNOSIS — E46 Unspecified protein-calorie malnutrition: Secondary | ICD-10-CM | POA: Diagnosis not present

## 2017-02-04 DIAGNOSIS — R634 Abnormal weight loss: Secondary | ICD-10-CM | POA: Diagnosis not present

## 2017-02-04 DIAGNOSIS — G309 Alzheimer's disease, unspecified: Secondary | ICD-10-CM | POA: Diagnosis not present

## 2017-02-04 DIAGNOSIS — F411 Generalized anxiety disorder: Secondary | ICD-10-CM | POA: Diagnosis not present

## 2017-02-04 DIAGNOSIS — F0281 Dementia in other diseases classified elsewhere with behavioral disturbance: Secondary | ICD-10-CM | POA: Diagnosis not present

## 2017-02-05 DIAGNOSIS — F339 Major depressive disorder, recurrent, unspecified: Secondary | ICD-10-CM | POA: Diagnosis not present

## 2017-02-05 DIAGNOSIS — F411 Generalized anxiety disorder: Secondary | ICD-10-CM | POA: Diagnosis not present

## 2017-02-05 DIAGNOSIS — G309 Alzheimer's disease, unspecified: Secondary | ICD-10-CM | POA: Diagnosis not present

## 2017-02-05 DIAGNOSIS — R634 Abnormal weight loss: Secondary | ICD-10-CM | POA: Diagnosis not present

## 2017-02-05 DIAGNOSIS — F0281 Dementia in other diseases classified elsewhere with behavioral disturbance: Secondary | ICD-10-CM | POA: Diagnosis not present

## 2017-02-05 DIAGNOSIS — E46 Unspecified protein-calorie malnutrition: Secondary | ICD-10-CM | POA: Diagnosis not present

## 2017-02-06 DIAGNOSIS — E46 Unspecified protein-calorie malnutrition: Secondary | ICD-10-CM | POA: Diagnosis not present

## 2017-02-06 DIAGNOSIS — F339 Major depressive disorder, recurrent, unspecified: Secondary | ICD-10-CM | POA: Diagnosis not present

## 2017-02-06 DIAGNOSIS — R634 Abnormal weight loss: Secondary | ICD-10-CM | POA: Diagnosis not present

## 2017-02-06 DIAGNOSIS — F0281 Dementia in other diseases classified elsewhere with behavioral disturbance: Secondary | ICD-10-CM | POA: Diagnosis not present

## 2017-02-06 DIAGNOSIS — G309 Alzheimer's disease, unspecified: Secondary | ICD-10-CM | POA: Diagnosis not present

## 2017-02-06 DIAGNOSIS — F411 Generalized anxiety disorder: Secondary | ICD-10-CM | POA: Diagnosis not present

## 2017-02-07 DIAGNOSIS — E46 Unspecified protein-calorie malnutrition: Secondary | ICD-10-CM | POA: Diagnosis not present

## 2017-02-07 DIAGNOSIS — R634 Abnormal weight loss: Secondary | ICD-10-CM | POA: Diagnosis not present

## 2017-02-07 DIAGNOSIS — F0281 Dementia in other diseases classified elsewhere with behavioral disturbance: Secondary | ICD-10-CM | POA: Diagnosis not present

## 2017-02-07 DIAGNOSIS — F339 Major depressive disorder, recurrent, unspecified: Secondary | ICD-10-CM | POA: Diagnosis not present

## 2017-02-07 DIAGNOSIS — F411 Generalized anxiety disorder: Secondary | ICD-10-CM | POA: Diagnosis not present

## 2017-02-07 DIAGNOSIS — G309 Alzheimer's disease, unspecified: Secondary | ICD-10-CM | POA: Diagnosis not present

## 2017-02-09 DIAGNOSIS — F411 Generalized anxiety disorder: Secondary | ICD-10-CM | POA: Diagnosis not present

## 2017-02-09 DIAGNOSIS — F0281 Dementia in other diseases classified elsewhere with behavioral disturbance: Secondary | ICD-10-CM | POA: Diagnosis not present

## 2017-02-09 DIAGNOSIS — R634 Abnormal weight loss: Secondary | ICD-10-CM | POA: Diagnosis not present

## 2017-02-09 DIAGNOSIS — G309 Alzheimer's disease, unspecified: Secondary | ICD-10-CM | POA: Diagnosis not present

## 2017-02-09 DIAGNOSIS — E46 Unspecified protein-calorie malnutrition: Secondary | ICD-10-CM | POA: Diagnosis not present

## 2017-02-09 DIAGNOSIS — F339 Major depressive disorder, recurrent, unspecified: Secondary | ICD-10-CM | POA: Diagnosis not present

## 2017-02-10 DIAGNOSIS — R634 Abnormal weight loss: Secondary | ICD-10-CM | POA: Diagnosis not present

## 2017-02-10 DIAGNOSIS — F411 Generalized anxiety disorder: Secondary | ICD-10-CM | POA: Diagnosis not present

## 2017-02-10 DIAGNOSIS — E46 Unspecified protein-calorie malnutrition: Secondary | ICD-10-CM | POA: Diagnosis not present

## 2017-02-10 DIAGNOSIS — F339 Major depressive disorder, recurrent, unspecified: Secondary | ICD-10-CM | POA: Diagnosis not present

## 2017-02-10 DIAGNOSIS — G309 Alzheimer's disease, unspecified: Secondary | ICD-10-CM | POA: Diagnosis not present

## 2017-02-10 DIAGNOSIS — F0281 Dementia in other diseases classified elsewhere with behavioral disturbance: Secondary | ICD-10-CM | POA: Diagnosis not present

## 2017-02-11 DIAGNOSIS — G309 Alzheimer's disease, unspecified: Secondary | ICD-10-CM | POA: Diagnosis not present

## 2017-02-11 DIAGNOSIS — E46 Unspecified protein-calorie malnutrition: Secondary | ICD-10-CM | POA: Diagnosis not present

## 2017-02-11 DIAGNOSIS — F0281 Dementia in other diseases classified elsewhere with behavioral disturbance: Secondary | ICD-10-CM | POA: Diagnosis not present

## 2017-02-11 DIAGNOSIS — F339 Major depressive disorder, recurrent, unspecified: Secondary | ICD-10-CM | POA: Diagnosis not present

## 2017-02-11 DIAGNOSIS — F411 Generalized anxiety disorder: Secondary | ICD-10-CM | POA: Diagnosis not present

## 2017-02-11 DIAGNOSIS — R634 Abnormal weight loss: Secondary | ICD-10-CM | POA: Diagnosis not present

## 2017-02-13 DIAGNOSIS — G309 Alzheimer's disease, unspecified: Secondary | ICD-10-CM | POA: Diagnosis not present

## 2017-02-13 DIAGNOSIS — R634 Abnormal weight loss: Secondary | ICD-10-CM | POA: Diagnosis not present

## 2017-02-13 DIAGNOSIS — F0281 Dementia in other diseases classified elsewhere with behavioral disturbance: Secondary | ICD-10-CM | POA: Diagnosis not present

## 2017-02-13 DIAGNOSIS — F411 Generalized anxiety disorder: Secondary | ICD-10-CM | POA: Diagnosis not present

## 2017-02-13 DIAGNOSIS — E46 Unspecified protein-calorie malnutrition: Secondary | ICD-10-CM | POA: Diagnosis not present

## 2017-02-13 DIAGNOSIS — F339 Major depressive disorder, recurrent, unspecified: Secondary | ICD-10-CM | POA: Diagnosis not present

## 2017-02-14 DIAGNOSIS — R634 Abnormal weight loss: Secondary | ICD-10-CM | POA: Diagnosis not present

## 2017-02-14 DIAGNOSIS — E46 Unspecified protein-calorie malnutrition: Secondary | ICD-10-CM | POA: Diagnosis not present

## 2017-02-14 DIAGNOSIS — F411 Generalized anxiety disorder: Secondary | ICD-10-CM | POA: Diagnosis not present

## 2017-02-14 DIAGNOSIS — F339 Major depressive disorder, recurrent, unspecified: Secondary | ICD-10-CM | POA: Diagnosis not present

## 2017-02-14 DIAGNOSIS — G309 Alzheimer's disease, unspecified: Secondary | ICD-10-CM | POA: Diagnosis not present

## 2017-02-14 DIAGNOSIS — F0281 Dementia in other diseases classified elsewhere with behavioral disturbance: Secondary | ICD-10-CM | POA: Diagnosis not present

## 2017-02-17 DIAGNOSIS — E46 Unspecified protein-calorie malnutrition: Secondary | ICD-10-CM | POA: Diagnosis not present

## 2017-02-17 DIAGNOSIS — F411 Generalized anxiety disorder: Secondary | ICD-10-CM | POA: Diagnosis not present

## 2017-02-17 DIAGNOSIS — F339 Major depressive disorder, recurrent, unspecified: Secondary | ICD-10-CM | POA: Diagnosis not present

## 2017-02-17 DIAGNOSIS — F0281 Dementia in other diseases classified elsewhere with behavioral disturbance: Secondary | ICD-10-CM | POA: Diagnosis not present

## 2017-02-17 DIAGNOSIS — G309 Alzheimer's disease, unspecified: Secondary | ICD-10-CM | POA: Diagnosis not present

## 2017-02-17 DIAGNOSIS — R634 Abnormal weight loss: Secondary | ICD-10-CM | POA: Diagnosis not present

## 2017-02-18 DIAGNOSIS — F411 Generalized anxiety disorder: Secondary | ICD-10-CM | POA: Diagnosis not present

## 2017-02-18 DIAGNOSIS — E46 Unspecified protein-calorie malnutrition: Secondary | ICD-10-CM | POA: Diagnosis not present

## 2017-02-18 DIAGNOSIS — R634 Abnormal weight loss: Secondary | ICD-10-CM | POA: Diagnosis not present

## 2017-02-18 DIAGNOSIS — F0281 Dementia in other diseases classified elsewhere with behavioral disturbance: Secondary | ICD-10-CM | POA: Diagnosis not present

## 2017-02-18 DIAGNOSIS — G309 Alzheimer's disease, unspecified: Secondary | ICD-10-CM | POA: Diagnosis not present

## 2017-02-18 DIAGNOSIS — F339 Major depressive disorder, recurrent, unspecified: Secondary | ICD-10-CM | POA: Diagnosis not present

## 2017-02-20 DIAGNOSIS — E46 Unspecified protein-calorie malnutrition: Secondary | ICD-10-CM | POA: Diagnosis not present

## 2017-02-20 DIAGNOSIS — F339 Major depressive disorder, recurrent, unspecified: Secondary | ICD-10-CM | POA: Diagnosis not present

## 2017-02-20 DIAGNOSIS — R634 Abnormal weight loss: Secondary | ICD-10-CM | POA: Diagnosis not present

## 2017-02-20 DIAGNOSIS — F411 Generalized anxiety disorder: Secondary | ICD-10-CM | POA: Diagnosis not present

## 2017-02-20 DIAGNOSIS — G309 Alzheimer's disease, unspecified: Secondary | ICD-10-CM | POA: Diagnosis not present

## 2017-02-20 DIAGNOSIS — F0281 Dementia in other diseases classified elsewhere with behavioral disturbance: Secondary | ICD-10-CM | POA: Diagnosis not present

## 2017-02-23 DIAGNOSIS — F0281 Dementia in other diseases classified elsewhere with behavioral disturbance: Secondary | ICD-10-CM | POA: Diagnosis not present

## 2017-02-23 DIAGNOSIS — F411 Generalized anxiety disorder: Secondary | ICD-10-CM | POA: Diagnosis not present

## 2017-02-23 DIAGNOSIS — E46 Unspecified protein-calorie malnutrition: Secondary | ICD-10-CM | POA: Diagnosis not present

## 2017-02-23 DIAGNOSIS — F339 Major depressive disorder, recurrent, unspecified: Secondary | ICD-10-CM | POA: Diagnosis not present

## 2017-02-23 DIAGNOSIS — R634 Abnormal weight loss: Secondary | ICD-10-CM | POA: Diagnosis not present

## 2017-02-23 DIAGNOSIS — G309 Alzheimer's disease, unspecified: Secondary | ICD-10-CM | POA: Diagnosis not present

## 2017-02-24 DIAGNOSIS — E46 Unspecified protein-calorie malnutrition: Secondary | ICD-10-CM | POA: Diagnosis not present

## 2017-02-24 DIAGNOSIS — G309 Alzheimer's disease, unspecified: Secondary | ICD-10-CM | POA: Diagnosis not present

## 2017-02-24 DIAGNOSIS — F0281 Dementia in other diseases classified elsewhere with behavioral disturbance: Secondary | ICD-10-CM | POA: Diagnosis not present

## 2017-02-24 DIAGNOSIS — F411 Generalized anxiety disorder: Secondary | ICD-10-CM | POA: Diagnosis not present

## 2017-02-24 DIAGNOSIS — R634 Abnormal weight loss: Secondary | ICD-10-CM | POA: Diagnosis not present

## 2017-02-24 DIAGNOSIS — F339 Major depressive disorder, recurrent, unspecified: Secondary | ICD-10-CM | POA: Diagnosis not present

## 2017-02-25 DIAGNOSIS — E46 Unspecified protein-calorie malnutrition: Secondary | ICD-10-CM | POA: Diagnosis not present

## 2017-02-25 DIAGNOSIS — F339 Major depressive disorder, recurrent, unspecified: Secondary | ICD-10-CM | POA: Diagnosis not present

## 2017-02-25 DIAGNOSIS — F0281 Dementia in other diseases classified elsewhere with behavioral disturbance: Secondary | ICD-10-CM | POA: Diagnosis not present

## 2017-02-25 DIAGNOSIS — R634 Abnormal weight loss: Secondary | ICD-10-CM | POA: Diagnosis not present

## 2017-02-25 DIAGNOSIS — F411 Generalized anxiety disorder: Secondary | ICD-10-CM | POA: Diagnosis not present

## 2017-02-25 DIAGNOSIS — G309 Alzheimer's disease, unspecified: Secondary | ICD-10-CM | POA: Diagnosis not present

## 2017-02-27 DIAGNOSIS — G309 Alzheimer's disease, unspecified: Secondary | ICD-10-CM | POA: Diagnosis not present

## 2017-02-27 DIAGNOSIS — F411 Generalized anxiety disorder: Secondary | ICD-10-CM | POA: Diagnosis not present

## 2017-02-27 DIAGNOSIS — F0281 Dementia in other diseases classified elsewhere with behavioral disturbance: Secondary | ICD-10-CM | POA: Diagnosis not present

## 2017-02-27 DIAGNOSIS — F339 Major depressive disorder, recurrent, unspecified: Secondary | ICD-10-CM | POA: Diagnosis not present

## 2017-02-27 DIAGNOSIS — R634 Abnormal weight loss: Secondary | ICD-10-CM | POA: Diagnosis not present

## 2017-02-27 DIAGNOSIS — E46 Unspecified protein-calorie malnutrition: Secondary | ICD-10-CM | POA: Diagnosis not present

## 2017-03-03 DIAGNOSIS — F411 Generalized anxiety disorder: Secondary | ICD-10-CM | POA: Diagnosis not present

## 2017-03-03 DIAGNOSIS — E46 Unspecified protein-calorie malnutrition: Secondary | ICD-10-CM | POA: Diagnosis not present

## 2017-03-03 DIAGNOSIS — F0281 Dementia in other diseases classified elsewhere with behavioral disturbance: Secondary | ICD-10-CM | POA: Diagnosis not present

## 2017-03-03 DIAGNOSIS — F339 Major depressive disorder, recurrent, unspecified: Secondary | ICD-10-CM | POA: Diagnosis not present

## 2017-03-03 DIAGNOSIS — G309 Alzheimer's disease, unspecified: Secondary | ICD-10-CM | POA: Diagnosis not present

## 2017-03-03 DIAGNOSIS — R634 Abnormal weight loss: Secondary | ICD-10-CM | POA: Diagnosis not present

## 2017-03-04 DIAGNOSIS — F339 Major depressive disorder, recurrent, unspecified: Secondary | ICD-10-CM | POA: Diagnosis not present

## 2017-03-04 DIAGNOSIS — E46 Unspecified protein-calorie malnutrition: Secondary | ICD-10-CM | POA: Diagnosis not present

## 2017-03-04 DIAGNOSIS — R634 Abnormal weight loss: Secondary | ICD-10-CM | POA: Diagnosis not present

## 2017-03-04 DIAGNOSIS — F411 Generalized anxiety disorder: Secondary | ICD-10-CM | POA: Diagnosis not present

## 2017-03-04 DIAGNOSIS — F0281 Dementia in other diseases classified elsewhere with behavioral disturbance: Secondary | ICD-10-CM | POA: Diagnosis not present

## 2017-03-04 DIAGNOSIS — G309 Alzheimer's disease, unspecified: Secondary | ICD-10-CM | POA: Diagnosis not present

## 2017-03-06 DIAGNOSIS — F411 Generalized anxiety disorder: Secondary | ICD-10-CM | POA: Diagnosis not present

## 2017-03-06 DIAGNOSIS — G309 Alzheimer's disease, unspecified: Secondary | ICD-10-CM | POA: Diagnosis not present

## 2017-03-06 DIAGNOSIS — R634 Abnormal weight loss: Secondary | ICD-10-CM | POA: Diagnosis not present

## 2017-03-06 DIAGNOSIS — F339 Major depressive disorder, recurrent, unspecified: Secondary | ICD-10-CM | POA: Diagnosis not present

## 2017-03-06 DIAGNOSIS — F0281 Dementia in other diseases classified elsewhere with behavioral disturbance: Secondary | ICD-10-CM | POA: Diagnosis not present

## 2017-03-06 DIAGNOSIS — E46 Unspecified protein-calorie malnutrition: Secondary | ICD-10-CM | POA: Diagnosis not present

## 2017-03-10 DIAGNOSIS — F411 Generalized anxiety disorder: Secondary | ICD-10-CM | POA: Diagnosis not present

## 2017-03-10 DIAGNOSIS — G309 Alzheimer's disease, unspecified: Secondary | ICD-10-CM | POA: Diagnosis not present

## 2017-03-10 DIAGNOSIS — E46 Unspecified protein-calorie malnutrition: Secondary | ICD-10-CM | POA: Diagnosis not present

## 2017-03-10 DIAGNOSIS — R634 Abnormal weight loss: Secondary | ICD-10-CM | POA: Diagnosis not present

## 2017-03-10 DIAGNOSIS — F339 Major depressive disorder, recurrent, unspecified: Secondary | ICD-10-CM | POA: Diagnosis not present

## 2017-03-10 DIAGNOSIS — F0281 Dementia in other diseases classified elsewhere with behavioral disturbance: Secondary | ICD-10-CM | POA: Diagnosis not present

## 2017-03-11 DIAGNOSIS — R634 Abnormal weight loss: Secondary | ICD-10-CM | POA: Diagnosis not present

## 2017-03-11 DIAGNOSIS — G309 Alzheimer's disease, unspecified: Secondary | ICD-10-CM | POA: Diagnosis not present

## 2017-03-11 DIAGNOSIS — F411 Generalized anxiety disorder: Secondary | ICD-10-CM | POA: Diagnosis not present

## 2017-03-11 DIAGNOSIS — F0281 Dementia in other diseases classified elsewhere with behavioral disturbance: Secondary | ICD-10-CM | POA: Diagnosis not present

## 2017-03-11 DIAGNOSIS — E46 Unspecified protein-calorie malnutrition: Secondary | ICD-10-CM | POA: Diagnosis not present

## 2017-03-11 DIAGNOSIS — F339 Major depressive disorder, recurrent, unspecified: Secondary | ICD-10-CM | POA: Diagnosis not present

## 2017-03-13 DIAGNOSIS — R634 Abnormal weight loss: Secondary | ICD-10-CM | POA: Diagnosis not present

## 2017-03-13 DIAGNOSIS — E46 Unspecified protein-calorie malnutrition: Secondary | ICD-10-CM | POA: Diagnosis not present

## 2017-03-13 DIAGNOSIS — F339 Major depressive disorder, recurrent, unspecified: Secondary | ICD-10-CM | POA: Diagnosis not present

## 2017-03-13 DIAGNOSIS — G309 Alzheimer's disease, unspecified: Secondary | ICD-10-CM | POA: Diagnosis not present

## 2017-03-13 DIAGNOSIS — F0281 Dementia in other diseases classified elsewhere with behavioral disturbance: Secondary | ICD-10-CM | POA: Diagnosis not present

## 2017-03-13 DIAGNOSIS — F411 Generalized anxiety disorder: Secondary | ICD-10-CM | POA: Diagnosis not present

## 2017-03-18 DIAGNOSIS — R634 Abnormal weight loss: Secondary | ICD-10-CM | POA: Diagnosis not present

## 2017-03-18 DIAGNOSIS — F0281 Dementia in other diseases classified elsewhere with behavioral disturbance: Secondary | ICD-10-CM | POA: Diagnosis not present

## 2017-03-18 DIAGNOSIS — F339 Major depressive disorder, recurrent, unspecified: Secondary | ICD-10-CM | POA: Diagnosis not present

## 2017-03-18 DIAGNOSIS — G309 Alzheimer's disease, unspecified: Secondary | ICD-10-CM | POA: Diagnosis not present

## 2017-03-18 DIAGNOSIS — F411 Generalized anxiety disorder: Secondary | ICD-10-CM | POA: Diagnosis not present

## 2017-03-18 DIAGNOSIS — E46 Unspecified protein-calorie malnutrition: Secondary | ICD-10-CM | POA: Diagnosis not present

## 2017-03-20 DIAGNOSIS — F411 Generalized anxiety disorder: Secondary | ICD-10-CM | POA: Diagnosis not present

## 2017-03-20 DIAGNOSIS — G309 Alzheimer's disease, unspecified: Secondary | ICD-10-CM | POA: Diagnosis not present

## 2017-03-20 DIAGNOSIS — E46 Unspecified protein-calorie malnutrition: Secondary | ICD-10-CM | POA: Diagnosis not present

## 2017-03-20 DIAGNOSIS — F339 Major depressive disorder, recurrent, unspecified: Secondary | ICD-10-CM | POA: Diagnosis not present

## 2017-03-20 DIAGNOSIS — R634 Abnormal weight loss: Secondary | ICD-10-CM | POA: Diagnosis not present

## 2017-03-20 DIAGNOSIS — F0281 Dementia in other diseases classified elsewhere with behavioral disturbance: Secondary | ICD-10-CM | POA: Diagnosis not present

## 2017-03-25 DIAGNOSIS — G309 Alzheimer's disease, unspecified: Secondary | ICD-10-CM | POA: Diagnosis not present

## 2017-03-25 DIAGNOSIS — F0281 Dementia in other diseases classified elsewhere with behavioral disturbance: Secondary | ICD-10-CM | POA: Diagnosis not present

## 2017-03-25 DIAGNOSIS — R634 Abnormal weight loss: Secondary | ICD-10-CM | POA: Diagnosis not present

## 2017-03-25 DIAGNOSIS — E46 Unspecified protein-calorie malnutrition: Secondary | ICD-10-CM | POA: Diagnosis not present

## 2017-03-25 DIAGNOSIS — F411 Generalized anxiety disorder: Secondary | ICD-10-CM | POA: Diagnosis not present

## 2017-03-25 DIAGNOSIS — F339 Major depressive disorder, recurrent, unspecified: Secondary | ICD-10-CM | POA: Diagnosis not present

## 2017-03-26 DIAGNOSIS — F411 Generalized anxiety disorder: Secondary | ICD-10-CM | POA: Diagnosis not present

## 2017-03-26 DIAGNOSIS — G309 Alzheimer's disease, unspecified: Secondary | ICD-10-CM | POA: Diagnosis not present

## 2017-03-26 DIAGNOSIS — E46 Unspecified protein-calorie malnutrition: Secondary | ICD-10-CM | POA: Diagnosis not present

## 2017-03-26 DIAGNOSIS — R634 Abnormal weight loss: Secondary | ICD-10-CM | POA: Diagnosis not present

## 2017-03-26 DIAGNOSIS — F339 Major depressive disorder, recurrent, unspecified: Secondary | ICD-10-CM | POA: Diagnosis not present

## 2017-03-26 DIAGNOSIS — F0281 Dementia in other diseases classified elsewhere with behavioral disturbance: Secondary | ICD-10-CM | POA: Diagnosis not present

## 2017-03-27 DIAGNOSIS — G309 Alzheimer's disease, unspecified: Secondary | ICD-10-CM | POA: Diagnosis not present

## 2017-03-27 DIAGNOSIS — F339 Major depressive disorder, recurrent, unspecified: Secondary | ICD-10-CM | POA: Diagnosis not present

## 2017-03-27 DIAGNOSIS — F0281 Dementia in other diseases classified elsewhere with behavioral disturbance: Secondary | ICD-10-CM | POA: Diagnosis not present

## 2017-03-27 DIAGNOSIS — R634 Abnormal weight loss: Secondary | ICD-10-CM | POA: Diagnosis not present

## 2017-03-27 DIAGNOSIS — E46 Unspecified protein-calorie malnutrition: Secondary | ICD-10-CM | POA: Diagnosis not present

## 2017-03-27 DIAGNOSIS — F411 Generalized anxiety disorder: Secondary | ICD-10-CM | POA: Diagnosis not present

## 2017-03-31 DIAGNOSIS — F0281 Dementia in other diseases classified elsewhere with behavioral disturbance: Secondary | ICD-10-CM | POA: Diagnosis not present

## 2017-03-31 DIAGNOSIS — F411 Generalized anxiety disorder: Secondary | ICD-10-CM | POA: Diagnosis not present

## 2017-03-31 DIAGNOSIS — F339 Major depressive disorder, recurrent, unspecified: Secondary | ICD-10-CM | POA: Diagnosis not present

## 2017-03-31 DIAGNOSIS — E46 Unspecified protein-calorie malnutrition: Secondary | ICD-10-CM | POA: Diagnosis not present

## 2017-03-31 DIAGNOSIS — R634 Abnormal weight loss: Secondary | ICD-10-CM | POA: Diagnosis not present

## 2017-03-31 DIAGNOSIS — G309 Alzheimer's disease, unspecified: Secondary | ICD-10-CM | POA: Diagnosis not present

## 2017-04-01 DIAGNOSIS — G309 Alzheimer's disease, unspecified: Secondary | ICD-10-CM | POA: Diagnosis not present

## 2017-04-01 DIAGNOSIS — F0281 Dementia in other diseases classified elsewhere with behavioral disturbance: Secondary | ICD-10-CM | POA: Diagnosis not present

## 2017-04-01 DIAGNOSIS — R634 Abnormal weight loss: Secondary | ICD-10-CM | POA: Diagnosis not present

## 2017-04-01 DIAGNOSIS — E46 Unspecified protein-calorie malnutrition: Secondary | ICD-10-CM | POA: Diagnosis not present

## 2017-04-01 DIAGNOSIS — F339 Major depressive disorder, recurrent, unspecified: Secondary | ICD-10-CM | POA: Diagnosis not present

## 2017-04-01 DIAGNOSIS — F411 Generalized anxiety disorder: Secondary | ICD-10-CM | POA: Diagnosis not present

## 2017-04-03 DIAGNOSIS — F339 Major depressive disorder, recurrent, unspecified: Secondary | ICD-10-CM | POA: Diagnosis not present

## 2017-04-03 DIAGNOSIS — E46 Unspecified protein-calorie malnutrition: Secondary | ICD-10-CM | POA: Diagnosis not present

## 2017-04-03 DIAGNOSIS — R634 Abnormal weight loss: Secondary | ICD-10-CM | POA: Diagnosis not present

## 2017-04-03 DIAGNOSIS — F411 Generalized anxiety disorder: Secondary | ICD-10-CM | POA: Diagnosis not present

## 2017-04-03 DIAGNOSIS — G309 Alzheimer's disease, unspecified: Secondary | ICD-10-CM | POA: Diagnosis not present

## 2017-04-03 DIAGNOSIS — F0281 Dementia in other diseases classified elsewhere with behavioral disturbance: Secondary | ICD-10-CM | POA: Diagnosis not present

## 2017-04-08 DIAGNOSIS — F411 Generalized anxiety disorder: Secondary | ICD-10-CM | POA: Diagnosis not present

## 2017-04-08 DIAGNOSIS — E46 Unspecified protein-calorie malnutrition: Secondary | ICD-10-CM | POA: Diagnosis not present

## 2017-04-08 DIAGNOSIS — G309 Alzheimer's disease, unspecified: Secondary | ICD-10-CM | POA: Diagnosis not present

## 2017-04-08 DIAGNOSIS — F339 Major depressive disorder, recurrent, unspecified: Secondary | ICD-10-CM | POA: Diagnosis not present

## 2017-04-08 DIAGNOSIS — F0281 Dementia in other diseases classified elsewhere with behavioral disturbance: Secondary | ICD-10-CM | POA: Diagnosis not present

## 2017-04-08 DIAGNOSIS — R634 Abnormal weight loss: Secondary | ICD-10-CM | POA: Diagnosis not present

## 2017-04-09 DIAGNOSIS — F0281 Dementia in other diseases classified elsewhere with behavioral disturbance: Secondary | ICD-10-CM | POA: Diagnosis not present

## 2017-04-09 DIAGNOSIS — F411 Generalized anxiety disorder: Secondary | ICD-10-CM | POA: Diagnosis not present

## 2017-04-09 DIAGNOSIS — G309 Alzheimer's disease, unspecified: Secondary | ICD-10-CM | POA: Diagnosis not present

## 2017-04-09 DIAGNOSIS — R634 Abnormal weight loss: Secondary | ICD-10-CM | POA: Diagnosis not present

## 2017-04-09 DIAGNOSIS — F339 Major depressive disorder, recurrent, unspecified: Secondary | ICD-10-CM | POA: Diagnosis not present

## 2017-04-09 DIAGNOSIS — E46 Unspecified protein-calorie malnutrition: Secondary | ICD-10-CM | POA: Diagnosis not present

## 2017-04-14 DIAGNOSIS — R634 Abnormal weight loss: Secondary | ICD-10-CM | POA: Diagnosis not present

## 2017-04-14 DIAGNOSIS — F339 Major depressive disorder, recurrent, unspecified: Secondary | ICD-10-CM | POA: Diagnosis not present

## 2017-04-14 DIAGNOSIS — F0281 Dementia in other diseases classified elsewhere with behavioral disturbance: Secondary | ICD-10-CM | POA: Diagnosis not present

## 2017-04-14 DIAGNOSIS — E46 Unspecified protein-calorie malnutrition: Secondary | ICD-10-CM | POA: Diagnosis not present

## 2017-04-14 DIAGNOSIS — G309 Alzheimer's disease, unspecified: Secondary | ICD-10-CM | POA: Diagnosis not present

## 2017-04-14 DIAGNOSIS — F411 Generalized anxiety disorder: Secondary | ICD-10-CM | POA: Diagnosis not present

## 2017-04-15 DIAGNOSIS — F339 Major depressive disorder, recurrent, unspecified: Secondary | ICD-10-CM | POA: Diagnosis not present

## 2017-04-15 DIAGNOSIS — G309 Alzheimer's disease, unspecified: Secondary | ICD-10-CM | POA: Diagnosis not present

## 2017-04-15 DIAGNOSIS — R634 Abnormal weight loss: Secondary | ICD-10-CM | POA: Diagnosis not present

## 2017-04-15 DIAGNOSIS — E46 Unspecified protein-calorie malnutrition: Secondary | ICD-10-CM | POA: Diagnosis not present

## 2017-04-15 DIAGNOSIS — F0281 Dementia in other diseases classified elsewhere with behavioral disturbance: Secondary | ICD-10-CM | POA: Diagnosis not present

## 2017-04-15 DIAGNOSIS — F411 Generalized anxiety disorder: Secondary | ICD-10-CM | POA: Diagnosis not present

## 2017-04-17 DIAGNOSIS — F0281 Dementia in other diseases classified elsewhere with behavioral disturbance: Secondary | ICD-10-CM | POA: Diagnosis not present

## 2017-04-17 DIAGNOSIS — F339 Major depressive disorder, recurrent, unspecified: Secondary | ICD-10-CM | POA: Diagnosis not present

## 2017-04-17 DIAGNOSIS — R634 Abnormal weight loss: Secondary | ICD-10-CM | POA: Diagnosis not present

## 2017-04-17 DIAGNOSIS — F411 Generalized anxiety disorder: Secondary | ICD-10-CM | POA: Diagnosis not present

## 2017-04-17 DIAGNOSIS — G309 Alzheimer's disease, unspecified: Secondary | ICD-10-CM | POA: Diagnosis not present

## 2017-04-17 DIAGNOSIS — E46 Unspecified protein-calorie malnutrition: Secondary | ICD-10-CM | POA: Diagnosis not present

## 2017-04-18 ENCOUNTER — Other Ambulatory Visit: Payer: Self-pay

## 2017-04-18 MED ORDER — LORAZEPAM 0.5 MG PO TABS
0.5000 mg | ORAL_TABLET | Freq: Every day | ORAL | 5 refills | Status: DC
Start: 2017-04-18 — End: 2018-02-02

## 2017-04-18 NOTE — Telephone Encounter (Signed)
Updated medication list as requested by Kermit Baloeed, Tiffany L, DO based on Mar from [facility].  RX for Xanax added, printed, and faxed back to [facility] @ [phone]

## 2017-04-19 DIAGNOSIS — E46 Unspecified protein-calorie malnutrition: Secondary | ICD-10-CM | POA: Diagnosis not present

## 2017-04-19 DIAGNOSIS — F411 Generalized anxiety disorder: Secondary | ICD-10-CM | POA: Diagnosis not present

## 2017-04-19 DIAGNOSIS — F339 Major depressive disorder, recurrent, unspecified: Secondary | ICD-10-CM | POA: Diagnosis not present

## 2017-04-19 DIAGNOSIS — G309 Alzheimer's disease, unspecified: Secondary | ICD-10-CM | POA: Diagnosis not present

## 2017-04-19 DIAGNOSIS — F0281 Dementia in other diseases classified elsewhere with behavioral disturbance: Secondary | ICD-10-CM | POA: Diagnosis not present

## 2017-04-19 DIAGNOSIS — R634 Abnormal weight loss: Secondary | ICD-10-CM | POA: Diagnosis not present

## 2017-04-22 DIAGNOSIS — G309 Alzheimer's disease, unspecified: Secondary | ICD-10-CM | POA: Diagnosis not present

## 2017-04-22 DIAGNOSIS — R634 Abnormal weight loss: Secondary | ICD-10-CM | POA: Diagnosis not present

## 2017-04-22 DIAGNOSIS — F0281 Dementia in other diseases classified elsewhere with behavioral disturbance: Secondary | ICD-10-CM | POA: Diagnosis not present

## 2017-04-22 DIAGNOSIS — E46 Unspecified protein-calorie malnutrition: Secondary | ICD-10-CM | POA: Diagnosis not present

## 2017-04-22 DIAGNOSIS — F339 Major depressive disorder, recurrent, unspecified: Secondary | ICD-10-CM | POA: Diagnosis not present

## 2017-04-22 DIAGNOSIS — F411 Generalized anxiety disorder: Secondary | ICD-10-CM | POA: Diagnosis not present

## 2017-04-23 ENCOUNTER — Telehealth: Payer: Self-pay | Admitting: *Deleted

## 2017-04-23 ENCOUNTER — Other Ambulatory Visit: Payer: Self-pay | Admitting: *Deleted

## 2017-04-23 NOTE — Telephone Encounter (Signed)
Candance with [facility] called and stated they found patient on her bottom in her room. No injury. Vitals Normal. Just FYI.

## 2017-04-23 NOTE — Telephone Encounter (Signed)
Noted.  Happening more and more recently.

## 2017-04-24 DIAGNOSIS — G309 Alzheimer's disease, unspecified: Secondary | ICD-10-CM | POA: Diagnosis not present

## 2017-04-24 DIAGNOSIS — R634 Abnormal weight loss: Secondary | ICD-10-CM | POA: Diagnosis not present

## 2017-04-24 DIAGNOSIS — F0281 Dementia in other diseases classified elsewhere with behavioral disturbance: Secondary | ICD-10-CM | POA: Diagnosis not present

## 2017-04-24 DIAGNOSIS — E46 Unspecified protein-calorie malnutrition: Secondary | ICD-10-CM | POA: Diagnosis not present

## 2017-04-24 DIAGNOSIS — F411 Generalized anxiety disorder: Secondary | ICD-10-CM | POA: Diagnosis not present

## 2017-04-24 DIAGNOSIS — F339 Major depressive disorder, recurrent, unspecified: Secondary | ICD-10-CM | POA: Diagnosis not present

## 2017-04-29 ENCOUNTER — Other Ambulatory Visit: Payer: Self-pay | Admitting: *Deleted

## 2017-04-29 DIAGNOSIS — R634 Abnormal weight loss: Secondary | ICD-10-CM | POA: Diagnosis not present

## 2017-04-29 DIAGNOSIS — F411 Generalized anxiety disorder: Secondary | ICD-10-CM | POA: Diagnosis not present

## 2017-04-29 DIAGNOSIS — F339 Major depressive disorder, recurrent, unspecified: Secondary | ICD-10-CM | POA: Diagnosis not present

## 2017-04-29 DIAGNOSIS — E46 Unspecified protein-calorie malnutrition: Secondary | ICD-10-CM | POA: Diagnosis not present

## 2017-04-29 DIAGNOSIS — F0281 Dementia in other diseases classified elsewhere with behavioral disturbance: Secondary | ICD-10-CM | POA: Diagnosis not present

## 2017-04-29 DIAGNOSIS — G309 Alzheimer's disease, unspecified: Secondary | ICD-10-CM | POA: Diagnosis not present

## 2017-04-29 MED ORDER — AMBULATORY NON FORMULARY MEDICATION: 5 refills | Status: DC

## 2017-04-29 NOTE — Telephone Encounter (Signed)
[facility] order for new Rx.

## 2017-04-30 ENCOUNTER — Telehealth: Payer: Self-pay

## 2017-04-30 NOTE — Telephone Encounter (Signed)
Called patient to try to schedule AWV in the office. No answer-left voicemail to call back.    

## 2017-05-01 DIAGNOSIS — E46 Unspecified protein-calorie malnutrition: Secondary | ICD-10-CM | POA: Diagnosis not present

## 2017-05-01 DIAGNOSIS — G309 Alzheimer's disease, unspecified: Secondary | ICD-10-CM | POA: Diagnosis not present

## 2017-05-01 DIAGNOSIS — F411 Generalized anxiety disorder: Secondary | ICD-10-CM | POA: Diagnosis not present

## 2017-05-01 DIAGNOSIS — R634 Abnormal weight loss: Secondary | ICD-10-CM | POA: Diagnosis not present

## 2017-05-01 DIAGNOSIS — F0281 Dementia in other diseases classified elsewhere with behavioral disturbance: Secondary | ICD-10-CM | POA: Diagnosis not present

## 2017-05-01 DIAGNOSIS — F339 Major depressive disorder, recurrent, unspecified: Secondary | ICD-10-CM | POA: Diagnosis not present

## 2017-05-02 ENCOUNTER — Telehealth: Payer: Self-pay

## 2017-05-02 NOTE — Telephone Encounter (Signed)
Spoke with patient's son to try and schedule AWV. He said he would call back

## 2017-05-06 DIAGNOSIS — F0281 Dementia in other diseases classified elsewhere with behavioral disturbance: Secondary | ICD-10-CM | POA: Diagnosis not present

## 2017-05-06 DIAGNOSIS — F411 Generalized anxiety disorder: Secondary | ICD-10-CM | POA: Diagnosis not present

## 2017-05-06 DIAGNOSIS — F339 Major depressive disorder, recurrent, unspecified: Secondary | ICD-10-CM | POA: Diagnosis not present

## 2017-05-06 DIAGNOSIS — R634 Abnormal weight loss: Secondary | ICD-10-CM | POA: Diagnosis not present

## 2017-05-06 DIAGNOSIS — E46 Unspecified protein-calorie malnutrition: Secondary | ICD-10-CM | POA: Diagnosis not present

## 2017-05-06 DIAGNOSIS — G309 Alzheimer's disease, unspecified: Secondary | ICD-10-CM | POA: Diagnosis not present

## 2017-05-08 DIAGNOSIS — G309 Alzheimer's disease, unspecified: Secondary | ICD-10-CM | POA: Diagnosis not present

## 2017-05-08 DIAGNOSIS — F339 Major depressive disorder, recurrent, unspecified: Secondary | ICD-10-CM | POA: Diagnosis not present

## 2017-05-08 DIAGNOSIS — F411 Generalized anxiety disorder: Secondary | ICD-10-CM | POA: Diagnosis not present

## 2017-05-08 DIAGNOSIS — R634 Abnormal weight loss: Secondary | ICD-10-CM | POA: Diagnosis not present

## 2017-05-08 DIAGNOSIS — F0281 Dementia in other diseases classified elsewhere with behavioral disturbance: Secondary | ICD-10-CM | POA: Diagnosis not present

## 2017-05-08 DIAGNOSIS — E46 Unspecified protein-calorie malnutrition: Secondary | ICD-10-CM | POA: Diagnosis not present

## 2017-05-09 DIAGNOSIS — F339 Major depressive disorder, recurrent, unspecified: Secondary | ICD-10-CM | POA: Diagnosis not present

## 2017-05-09 DIAGNOSIS — F0281 Dementia in other diseases classified elsewhere with behavioral disturbance: Secondary | ICD-10-CM | POA: Diagnosis not present

## 2017-05-09 DIAGNOSIS — G309 Alzheimer's disease, unspecified: Secondary | ICD-10-CM | POA: Diagnosis not present

## 2017-05-09 DIAGNOSIS — F411 Generalized anxiety disorder: Secondary | ICD-10-CM | POA: Diagnosis not present

## 2017-05-09 DIAGNOSIS — R634 Abnormal weight loss: Secondary | ICD-10-CM | POA: Diagnosis not present

## 2017-05-09 DIAGNOSIS — E46 Unspecified protein-calorie malnutrition: Secondary | ICD-10-CM | POA: Diagnosis not present

## 2017-05-12 DIAGNOSIS — E46 Unspecified protein-calorie malnutrition: Secondary | ICD-10-CM | POA: Diagnosis not present

## 2017-05-12 DIAGNOSIS — G309 Alzheimer's disease, unspecified: Secondary | ICD-10-CM | POA: Diagnosis not present

## 2017-05-12 DIAGNOSIS — R634 Abnormal weight loss: Secondary | ICD-10-CM | POA: Diagnosis not present

## 2017-05-12 DIAGNOSIS — F339 Major depressive disorder, recurrent, unspecified: Secondary | ICD-10-CM | POA: Diagnosis not present

## 2017-05-12 DIAGNOSIS — F0281 Dementia in other diseases classified elsewhere with behavioral disturbance: Secondary | ICD-10-CM | POA: Diagnosis not present

## 2017-05-12 DIAGNOSIS — F411 Generalized anxiety disorder: Secondary | ICD-10-CM | POA: Diagnosis not present

## 2017-05-15 DIAGNOSIS — F339 Major depressive disorder, recurrent, unspecified: Secondary | ICD-10-CM | POA: Diagnosis not present

## 2017-05-15 DIAGNOSIS — G309 Alzheimer's disease, unspecified: Secondary | ICD-10-CM | POA: Diagnosis not present

## 2017-05-15 DIAGNOSIS — F411 Generalized anxiety disorder: Secondary | ICD-10-CM | POA: Diagnosis not present

## 2017-05-15 DIAGNOSIS — E46 Unspecified protein-calorie malnutrition: Secondary | ICD-10-CM | POA: Diagnosis not present

## 2017-05-15 DIAGNOSIS — R634 Abnormal weight loss: Secondary | ICD-10-CM | POA: Diagnosis not present

## 2017-05-15 DIAGNOSIS — F0281 Dementia in other diseases classified elsewhere with behavioral disturbance: Secondary | ICD-10-CM | POA: Diagnosis not present

## 2017-05-16 DIAGNOSIS — G309 Alzheimer's disease, unspecified: Secondary | ICD-10-CM | POA: Diagnosis not present

## 2017-05-16 DIAGNOSIS — F339 Major depressive disorder, recurrent, unspecified: Secondary | ICD-10-CM | POA: Diagnosis not present

## 2017-05-16 DIAGNOSIS — F0281 Dementia in other diseases classified elsewhere with behavioral disturbance: Secondary | ICD-10-CM | POA: Diagnosis not present

## 2017-05-16 DIAGNOSIS — R634 Abnormal weight loss: Secondary | ICD-10-CM | POA: Diagnosis not present

## 2017-05-16 DIAGNOSIS — E46 Unspecified protein-calorie malnutrition: Secondary | ICD-10-CM | POA: Diagnosis not present

## 2017-05-16 DIAGNOSIS — F411 Generalized anxiety disorder: Secondary | ICD-10-CM | POA: Diagnosis not present

## 2017-05-18 DIAGNOSIS — E46 Unspecified protein-calorie malnutrition: Secondary | ICD-10-CM | POA: Diagnosis not present

## 2017-05-18 DIAGNOSIS — R634 Abnormal weight loss: Secondary | ICD-10-CM | POA: Diagnosis not present

## 2017-05-18 DIAGNOSIS — F411 Generalized anxiety disorder: Secondary | ICD-10-CM | POA: Diagnosis not present

## 2017-05-18 DIAGNOSIS — F339 Major depressive disorder, recurrent, unspecified: Secondary | ICD-10-CM | POA: Diagnosis not present

## 2017-05-18 DIAGNOSIS — G309 Alzheimer's disease, unspecified: Secondary | ICD-10-CM | POA: Diagnosis not present

## 2017-05-18 DIAGNOSIS — F0281 Dementia in other diseases classified elsewhere with behavioral disturbance: Secondary | ICD-10-CM | POA: Diagnosis not present

## 2017-05-20 DIAGNOSIS — F411 Generalized anxiety disorder: Secondary | ICD-10-CM | POA: Diagnosis not present

## 2017-05-20 DIAGNOSIS — F339 Major depressive disorder, recurrent, unspecified: Secondary | ICD-10-CM | POA: Diagnosis not present

## 2017-05-20 DIAGNOSIS — E46 Unspecified protein-calorie malnutrition: Secondary | ICD-10-CM | POA: Diagnosis not present

## 2017-05-20 DIAGNOSIS — R634 Abnormal weight loss: Secondary | ICD-10-CM | POA: Diagnosis not present

## 2017-05-20 DIAGNOSIS — F0281 Dementia in other diseases classified elsewhere with behavioral disturbance: Secondary | ICD-10-CM | POA: Diagnosis not present

## 2017-05-20 DIAGNOSIS — G309 Alzheimer's disease, unspecified: Secondary | ICD-10-CM | POA: Diagnosis not present

## 2017-05-29 ENCOUNTER — Encounter (HOSPITAL_COMMUNITY): Payer: Self-pay | Admitting: Emergency Medicine

## 2017-05-29 ENCOUNTER — Emergency Department (HOSPITAL_COMMUNITY)
Admission: EM | Admit: 2017-05-29 | Discharge: 2017-05-29 | Disposition: A | Discharge status: Home / Self Care | Attending: Emergency Medicine | Admitting: Emergency Medicine

## 2017-05-29 DIAGNOSIS — Z79899 Other long term (current) drug therapy: Secondary | ICD-10-CM | POA: Insufficient documentation

## 2017-05-29 DIAGNOSIS — S0990XA Unspecified injury of head, initial encounter: Secondary | ICD-10-CM | POA: Diagnosis present

## 2017-05-29 DIAGNOSIS — G309 Alzheimer's disease, unspecified: Secondary | ICD-10-CM | POA: Insufficient documentation

## 2017-05-29 DIAGNOSIS — S098XXA Other specified injuries of head, initial encounter: Secondary | ICD-10-CM | POA: Diagnosis not present

## 2017-05-29 DIAGNOSIS — W19XXXA Unspecified fall, initial encounter: Secondary | ICD-10-CM | POA: Insufficient documentation

## 2017-05-29 DIAGNOSIS — S0083XA Contusion of other part of head, initial encounter: Secondary | ICD-10-CM | POA: Insufficient documentation

## 2017-05-29 DIAGNOSIS — G8911 Acute pain due to trauma: Secondary | ICD-10-CM | POA: Diagnosis not present

## 2017-05-29 DIAGNOSIS — Y999 Unspecified external cause status: Secondary | ICD-10-CM | POA: Diagnosis not present

## 2017-05-29 DIAGNOSIS — Z515 Encounter for palliative care: Secondary | ICD-10-CM | POA: Diagnosis not present

## 2017-05-29 DIAGNOSIS — S0511XA Contusion of eyeball and orbital tissues, right eye, initial encounter: Secondary | ICD-10-CM | POA: Diagnosis not present

## 2017-05-29 DIAGNOSIS — J449 Chronic obstructive pulmonary disease, unspecified: Secondary | ICD-10-CM | POA: Insufficient documentation

## 2017-05-29 DIAGNOSIS — Z87891 Personal history of nicotine dependence: Secondary | ICD-10-CM | POA: Diagnosis not present

## 2017-05-29 DIAGNOSIS — Y929 Unspecified place or not applicable: Secondary | ICD-10-CM | POA: Insufficient documentation

## 2017-05-29 DIAGNOSIS — S064X0A Epidural hemorrhage without loss of consciousness, initial encounter: Secondary | ICD-10-CM | POA: Diagnosis not present

## 2017-05-29 DIAGNOSIS — F0390 Unspecified dementia without behavioral disturbance: Secondary | ICD-10-CM | POA: Diagnosis not present

## 2017-05-29 DIAGNOSIS — Y939 Activity, unspecified: Secondary | ICD-10-CM | POA: Insufficient documentation

## 2017-05-29 LAB — CBC WITH DIFFERENTIAL/PLATELET
BASOS PCT: 0 %
Basophils Absolute: 0 10*3/uL (ref 0.0–0.1)
EOS ABS: 0.1 10*3/uL (ref 0.0–0.7)
Eosinophils Relative: 2 %
HCT: 39.7 % (ref 36.0–46.0)
Hemoglobin: 12.7 g/dL (ref 12.0–15.0)
Lymphocytes Relative: 31 %
Lymphs Abs: 2.3 10*3/uL (ref 0.7–4.0)
MCH: 29.7 pg (ref 26.0–34.0)
MCHC: 32 g/dL (ref 30.0–36.0)
MCV: 92.8 fL (ref 78.0–100.0)
MONO ABS: 0.5 10*3/uL (ref 0.1–1.0)
MONOS PCT: 6 %
NEUTROS PCT: 61 %
Neutro Abs: 4.3 10*3/uL (ref 1.7–7.7)
Platelets: 169 10*3/uL (ref 150–400)
RBC: 4.28 MIL/uL (ref 3.87–5.11)
RDW: 14.2 % (ref 11.5–15.5)
WBC: 7.2 10*3/uL (ref 4.0–10.5)

## 2017-05-29 LAB — COMPREHENSIVE METABOLIC PANEL
ALBUMIN: 3.9 g/dL (ref 3.5–5.0)
ALT: 46 U/L (ref 14–54)
ANION GAP: 4 — AB (ref 5–15)
AST: 32 U/L (ref 15–41)
Alkaline Phosphatase: 49 U/L (ref 38–126)
BUN: 15 mg/dL (ref 6–20)
CALCIUM: 8.9 mg/dL (ref 8.9–10.3)
CO2: 29 mmol/L (ref 22–32)
CREATININE: 0.43 mg/dL — AB (ref 0.44–1.00)
Chloride: 109 mmol/L (ref 101–111)
GFR calc non Af Amer: >60 mL/min (ref >60)
GLUCOSE: 91 mg/dL (ref 65–99)
Potassium: 3.9 mmol/L (ref 3.5–5.1)
SODIUM: 142 mmol/L (ref 135–145)
TOTAL PROTEIN: 6.7 g/dL (ref 6.5–8.1)
Total Bilirubin: 0.6 mg/dL (ref 0.3–1.2)

## 2017-05-29 LAB — VALPROIC ACID LEVEL

## 2017-05-29 LAB — AMMONIA: Ammonia: 13 umol/L (ref 9–35)

## 2017-05-29 NOTE — ED Triage Notes (Signed)
Per EMS-states patient has been having full body twitches causing her to fall-facility states 2 falls this am-states she struck head-no LOC, no blood thinners-patient is on hospice-daughter only was necessary tests done, no IVs, antibiotics-2 hematomas to right side and occipital-patient has a history of dementia-screaming out, aggressive-needs constant redirection and reassurance

## 2017-05-29 NOTE — ED Provider Notes (Signed)
[facility] DEPT Provider Note   CSN: [id number] Arrival date & time: 05/29/17  0906     History   Chief Complaint Chief Complaint  Patient presents with  . Fall    HPI Sherri Moses is a 67 y.o. female.  HPI   67 year old female with history of dementia, COPD, on hospice, presents with concern for fall.  She has been having jerking like movements of her whole body intermittently over the last few days, which have caused her to fall.  She has had approximately 3 falls in the last 24 hours.  She is otherwise acting at her baseline.  She hit her head during some of the falls.  Family, including her daughter-in-law who is a Merchandiser, retail, goals of care including patient being appropriate for full comfort care, and prefer conservative measures, no IVs, no antibiotics, no hospitalization or surgeries.  The focus of her care now is to keep her comfortable and happy.  The facility insisted she come to ED after falls, although the family is not interested in any interventions per her goals of care.  Patient is nonverbal, in no acute distress, alert, trying to get out of bed.  Past Medical History:  Diagnosis Date  . Alzheimer disease 2008  . Anxiety   . COPD (chronic obstructive pulmonary disease) ([place]) 2013  . Depression   . Insomnia     Patient Active Problem List   Diagnosis Date Noted  . Frequent falls 01/25/2016  . Anxiety 11/23/2015  . Loss of weight 11/23/2015  . Insomnia 09/03/2013  . Panic attacks 07/26/2013  . Alzheimer's disease, early onset 07/26/2013  . Generalized osteoarthritis of multiple sites 07/26/2013  . COPD (chronic obstructive pulmonary disease) ([place])   . Depression     Past Surgical History:  Procedure Laterality Date  . ABDOMINAL HYSTERECTOMY    . APPENDECTOMY    . carpeal tunnel repair    . CATARACT EXTRACTION, BILATERAL  July 2015  . CHOLECYSTECTOMY    . ROTATOR CUFF REPAIR      OB History    No data  available       Home Medications    Prior to Admission medications   Medication Sig Start Date End Date Taking? Authorizing Provider  acetaminophen (TYLENOL) 500 MG tablet Take 500 mg by mouth 2 (two) times daily. And daily as needed for breakthrough    [provider]  AMBULATORY NON FORMULARY MEDICATION Ativan Gel 0.56m Sig: Topically to neck or arms every 6 hours as needed for agitation/anxiety 04/29/17   ELauree Chandler NP  divalproex (DEPAKOTE SPRINKLE) 125 MG capsule Take 250 mg by mouth 2 (two) times daily.    [provider]  loperamide (IMODIUM A-D) 2 MG tablet 2 tablets by mouth 1st loose stool then 1 by mouth every other loose stool. Do not take more than 8 tablets in 24 hours    [provider]  LORazepam (ATIVAN) 0.5 MG tablet Take 1 tablet (0.5 mg total) by mouth daily. 04/18/17   Reed, Tiffany L, DO  meloxicam (MOBIC) 15 MG tablet Take 15 mg by mouth daily.    [provider]  polyethylene glycol (MIRALAX / GLYCOLAX) packet Take 17 g by mouth daily.    [provider]  QUEtiapine (SEROQUEL) 25 MG tablet Take 12.5 mg by mouth 2 (two) times daily.    [provider]  sennosides-docusate sodium (SENOKOT-S) 8.6-50 MG tablet Take 1 tablet by mouth daily.    [provider]  UNABLE TO FIND Med Name: Mighty shake drink one can by mouth every day    [provider]  UNABLE TO FIND Med Name: Magic Cup by mouth three times daily    [provider]    Family History Family History  Problem Relation Age of Onset  . Leukemia Mother   . Stroke Father     Social History Social History   Tobacco Use  . Smoking status: Former Smoker    Years: 40.00    Last attempt to quit: 03/20/2013    Years since quitting: 4.1  . Smokeless tobacco: Never Used  Substance Use Topics  . Alcohol use: No  . Drug use: No     Allergies   Codeine   Review of Systems Review of Systems  Unable to perform ROS:  Dementia     Physical Exam Updated Vital Signs BP (!) 145/115 (BP Location: Left Arm)   Pulse (!) 58   Resp 18   Physical Exam  Constitutional: She appears well-developed and well-nourished. No distress.  HENT:  Head: Normocephalic.  Right periorbital hematoma No battle signs or racoons eyes  Eyes: Conjunctivae and EOM are normal.  Neck: Normal range of motion.  Cardiovascular: Normal rate, regular rhythm, normal heart sounds and intact distal pulses. Exam reveals no gallop and no friction rub.  No murmur heard. Pulmonary/Chest: Effort normal and breath sounds normal. No respiratory distress. She has no wheezes. She has no rales.  Abdominal: Soft. She exhibits no distension. There is no tenderness. There is no guarding.  Musculoskeletal: She exhibits no edema or tenderness (no C/T/L tenderness).  Full ROM upper and lower extremities spontaneously  Neurological: She is alert.  No focal weakness  Skin: Skin is warm and dry. No rash noted. She is not diaphoretic. No erythema.  Nursing note and vitals reviewed.    ED Treatments / Results  Labs (all labs ordered are listed, but only abnormal results are displayed) Labs Reviewed  COMPREHENSIVE METABOLIC PANEL - Abnormal; Notable for the following components:      Result Value   Creatinine, Ser 0.43 (*)    Anion gap 4 (*)    All other components within normal limits  VALPROIC ACID LEVEL - Abnormal; Notable for the following components:   Valproic Acid Lvl <10 (*)    All other components within normal limits  CBC WITH DIFFERENTIAL/PLATELET  AMMONIA    EKG  EKG Interpretation None       Radiology No results found.  Procedures Procedures (including critical care time)  Medications Ordered in ED Medications - No data to display   Initial Impression / Assessment and Plan / ED Course  I have reviewed the triage vital signs and the nursing notes.  Pertinent labs & imaging results that were available during my care  of the patient were reviewed by me and considered in my medical decision making (see chart for details).     67 year old female with history of dementia, COPD, on hospice, presents with concern for fall.  Family is also describing what sounds like myoclonic jerks.  These do not sound like seizures given patient does not lose consciousness and they involve the whole body.  After discussion with daughter-in-law, agree to check labs for electrolyte abnormalities, Depakote and ammonia levels, to see if her depakote needs to be adjusted or if there are signs of toxicity, other metabolic derangements to result in myoclonus. Labs without acute abnormalities, depakote level low.  Patient has  a periorbital hematoma, however does not appear to be in pain or distress, is acting normally, moving all 4 extremities.  Discussed whether or not to obtain imaging with family in detail, and agree that given a CT scan will not change the course of care, and will likely require sedation, do not feel that imaging will be of benefit to the patient given her current goals of care.    Called facility and spoke with staff, reiterating the patient's goals of care as discussed with hospice and the patient's family.  Discussed that unless she appears to have comfort issues which are not met at the facility and feel she needs transport to ED for more aggressive comfort, transport to hospital and hospitalization are not within her goals of care.  I had discussed this with family and hospice nurse at bedside, and agree and told the facility that these are her goals and are appropriate for her under hospice care.  Hospice should be notified first of any questions they have regarding her comfort or care.     Final Clinical Impressions(s) / ED Diagnoses   Final diagnoses:  Fall, initial encounter  Contusion of face, initial encounter  Hospice care    ED Discharge Orders    None       Gareth Morgan, MD 05/29/17 2226

## 2017-05-29 NOTE — ED Notes (Signed)
[facility] called and En Route to facility at this time. [facility] and given to [place], [place] about pt return, findings, and followup.

## 2017-05-29 NOTE — Progress Notes (Signed)
0930 am--[facility] RN visit  Notified by daughter in law, Eartha InchKristen Dirico [facility]. She has reiterated that she does not want pt to be hospitalized, no IV's, possibly no antibiotics. She states she only wants tests done that are necessary.  Kristen relays that Ms. Sherri Moses is nonverbal, has been for 3 years. She paces all day, every day. She does not like to be restrained or restricted, increases agitation.   We will continue to follow while in the ED and anticipate any discharge needs.  Please call with any hospice related questions or concerns.  Thank you, Haynes Bastracy Ennis, RN, BSN [phone]  [facility] are on [facility].

## 2017-05-29 NOTE — ED Notes (Signed)
Bed: [facility] Expected date:  Expected time:  Means of arrival:  Comments: EMS-fall 

## 2017-05-31 ENCOUNTER — Other Ambulatory Visit: Payer: Self-pay | Admitting: Internal Medicine

## 2017-06-20 DIAGNOSIS — G309 Alzheimer's disease, unspecified: Secondary | ICD-10-CM | POA: Diagnosis not present

## 2017-06-20 DIAGNOSIS — E46 Unspecified protein-calorie malnutrition: Secondary | ICD-10-CM | POA: Diagnosis not present

## 2017-06-20 DIAGNOSIS — R634 Abnormal weight loss: Secondary | ICD-10-CM | POA: Diagnosis not present

## 2017-06-20 DIAGNOSIS — F411 Generalized anxiety disorder: Secondary | ICD-10-CM | POA: Diagnosis not present

## 2017-06-20 DIAGNOSIS — F0281 Dementia in other diseases classified elsewhere with behavioral disturbance: Secondary | ICD-10-CM | POA: Diagnosis not present

## 2017-06-20 DIAGNOSIS — F339 Major depressive disorder, recurrent, unspecified: Secondary | ICD-10-CM | POA: Diagnosis not present

## 2017-06-22 DIAGNOSIS — R634 Abnormal weight loss: Secondary | ICD-10-CM | POA: Diagnosis not present

## 2017-06-22 DIAGNOSIS — F411 Generalized anxiety disorder: Secondary | ICD-10-CM | POA: Diagnosis not present

## 2017-06-22 DIAGNOSIS — F339 Major depressive disorder, recurrent, unspecified: Secondary | ICD-10-CM | POA: Diagnosis not present

## 2017-06-22 DIAGNOSIS — F0281 Dementia in other diseases classified elsewhere with behavioral disturbance: Secondary | ICD-10-CM | POA: Diagnosis not present

## 2017-06-22 DIAGNOSIS — E46 Unspecified protein-calorie malnutrition: Secondary | ICD-10-CM | POA: Diagnosis not present

## 2017-06-22 DIAGNOSIS — G309 Alzheimer's disease, unspecified: Secondary | ICD-10-CM | POA: Diagnosis not present

## 2017-06-23 DIAGNOSIS — F411 Generalized anxiety disorder: Secondary | ICD-10-CM | POA: Diagnosis not present

## 2017-06-23 DIAGNOSIS — F0281 Dementia in other diseases classified elsewhere with behavioral disturbance: Secondary | ICD-10-CM | POA: Diagnosis not present

## 2017-06-23 DIAGNOSIS — E46 Unspecified protein-calorie malnutrition: Secondary | ICD-10-CM | POA: Diagnosis not present

## 2017-06-23 DIAGNOSIS — R634 Abnormal weight loss: Secondary | ICD-10-CM | POA: Diagnosis not present

## 2017-06-23 DIAGNOSIS — G309 Alzheimer's disease, unspecified: Secondary | ICD-10-CM | POA: Diagnosis not present

## 2017-06-23 DIAGNOSIS — F339 Major depressive disorder, recurrent, unspecified: Secondary | ICD-10-CM | POA: Diagnosis not present

## 2017-06-24 DIAGNOSIS — G309 Alzheimer's disease, unspecified: Secondary | ICD-10-CM | POA: Diagnosis not present

## 2017-06-24 DIAGNOSIS — E46 Unspecified protein-calorie malnutrition: Secondary | ICD-10-CM | POA: Diagnosis not present

## 2017-06-24 DIAGNOSIS — F0281 Dementia in other diseases classified elsewhere with behavioral disturbance: Secondary | ICD-10-CM | POA: Diagnosis not present

## 2017-06-24 DIAGNOSIS — F339 Major depressive disorder, recurrent, unspecified: Secondary | ICD-10-CM | POA: Diagnosis not present

## 2017-06-24 DIAGNOSIS — R634 Abnormal weight loss: Secondary | ICD-10-CM | POA: Diagnosis not present

## 2017-06-24 DIAGNOSIS — F411 Generalized anxiety disorder: Secondary | ICD-10-CM | POA: Diagnosis not present

## 2017-06-25 DIAGNOSIS — E46 Unspecified protein-calorie malnutrition: Secondary | ICD-10-CM | POA: Diagnosis not present

## 2017-06-25 DIAGNOSIS — F339 Major depressive disorder, recurrent, unspecified: Secondary | ICD-10-CM | POA: Diagnosis not present

## 2017-06-25 DIAGNOSIS — G309 Alzheimer's disease, unspecified: Secondary | ICD-10-CM | POA: Diagnosis not present

## 2017-06-25 DIAGNOSIS — R634 Abnormal weight loss: Secondary | ICD-10-CM | POA: Diagnosis not present

## 2017-06-25 DIAGNOSIS — F411 Generalized anxiety disorder: Secondary | ICD-10-CM | POA: Diagnosis not present

## 2017-06-25 DIAGNOSIS — F0281 Dementia in other diseases classified elsewhere with behavioral disturbance: Secondary | ICD-10-CM | POA: Diagnosis not present

## 2017-06-26 DIAGNOSIS — G309 Alzheimer's disease, unspecified: Secondary | ICD-10-CM | POA: Diagnosis not present

## 2017-06-26 DIAGNOSIS — F339 Major depressive disorder, recurrent, unspecified: Secondary | ICD-10-CM | POA: Diagnosis not present

## 2017-06-26 DIAGNOSIS — R634 Abnormal weight loss: Secondary | ICD-10-CM | POA: Diagnosis not present

## 2017-06-26 DIAGNOSIS — F0281 Dementia in other diseases classified elsewhere with behavioral disturbance: Secondary | ICD-10-CM | POA: Diagnosis not present

## 2017-06-26 DIAGNOSIS — F411 Generalized anxiety disorder: Secondary | ICD-10-CM | POA: Diagnosis not present

## 2017-06-26 DIAGNOSIS — E46 Unspecified protein-calorie malnutrition: Secondary | ICD-10-CM | POA: Diagnosis not present

## 2017-07-01 DIAGNOSIS — E46 Unspecified protein-calorie malnutrition: Secondary | ICD-10-CM | POA: Diagnosis not present

## 2017-07-01 DIAGNOSIS — G309 Alzheimer's disease, unspecified: Secondary | ICD-10-CM | POA: Diagnosis not present

## 2017-07-01 DIAGNOSIS — F0281 Dementia in other diseases classified elsewhere with behavioral disturbance: Secondary | ICD-10-CM | POA: Diagnosis not present

## 2017-07-01 DIAGNOSIS — F339 Major depressive disorder, recurrent, unspecified: Secondary | ICD-10-CM | POA: Diagnosis not present

## 2017-07-01 DIAGNOSIS — F411 Generalized anxiety disorder: Secondary | ICD-10-CM | POA: Diagnosis not present

## 2017-07-01 DIAGNOSIS — R634 Abnormal weight loss: Secondary | ICD-10-CM | POA: Diagnosis not present

## 2017-07-02 DIAGNOSIS — F411 Generalized anxiety disorder: Secondary | ICD-10-CM | POA: Diagnosis not present

## 2017-07-02 DIAGNOSIS — G309 Alzheimer's disease, unspecified: Secondary | ICD-10-CM | POA: Diagnosis not present

## 2017-07-02 DIAGNOSIS — E46 Unspecified protein-calorie malnutrition: Secondary | ICD-10-CM | POA: Diagnosis not present

## 2017-07-02 DIAGNOSIS — F0281 Dementia in other diseases classified elsewhere with behavioral disturbance: Secondary | ICD-10-CM | POA: Diagnosis not present

## 2017-07-02 DIAGNOSIS — R634 Abnormal weight loss: Secondary | ICD-10-CM | POA: Diagnosis not present

## 2017-07-02 DIAGNOSIS — F339 Major depressive disorder, recurrent, unspecified: Secondary | ICD-10-CM | POA: Diagnosis not present

## 2017-07-03 DIAGNOSIS — F0281 Dementia in other diseases classified elsewhere with behavioral disturbance: Secondary | ICD-10-CM | POA: Diagnosis not present

## 2017-07-03 DIAGNOSIS — G309 Alzheimer's disease, unspecified: Secondary | ICD-10-CM | POA: Diagnosis not present

## 2017-07-03 DIAGNOSIS — E46 Unspecified protein-calorie malnutrition: Secondary | ICD-10-CM | POA: Diagnosis not present

## 2017-07-03 DIAGNOSIS — R634 Abnormal weight loss: Secondary | ICD-10-CM | POA: Diagnosis not present

## 2017-07-03 DIAGNOSIS — F339 Major depressive disorder, recurrent, unspecified: Secondary | ICD-10-CM | POA: Diagnosis not present

## 2017-07-03 DIAGNOSIS — F411 Generalized anxiety disorder: Secondary | ICD-10-CM | POA: Diagnosis not present

## 2017-07-06 DIAGNOSIS — F411 Generalized anxiety disorder: Secondary | ICD-10-CM | POA: Diagnosis not present

## 2017-07-06 DIAGNOSIS — F0281 Dementia in other diseases classified elsewhere with behavioral disturbance: Secondary | ICD-10-CM | POA: Diagnosis not present

## 2017-07-06 DIAGNOSIS — G309 Alzheimer's disease, unspecified: Secondary | ICD-10-CM | POA: Diagnosis not present

## 2017-07-06 DIAGNOSIS — R634 Abnormal weight loss: Secondary | ICD-10-CM | POA: Diagnosis not present

## 2017-07-06 DIAGNOSIS — E46 Unspecified protein-calorie malnutrition: Secondary | ICD-10-CM | POA: Diagnosis not present

## 2017-07-06 DIAGNOSIS — F339 Major depressive disorder, recurrent, unspecified: Secondary | ICD-10-CM | POA: Diagnosis not present

## 2017-07-08 DIAGNOSIS — F0281 Dementia in other diseases classified elsewhere with behavioral disturbance: Secondary | ICD-10-CM | POA: Diagnosis not present

## 2017-07-08 DIAGNOSIS — F411 Generalized anxiety disorder: Secondary | ICD-10-CM | POA: Diagnosis not present

## 2017-07-08 DIAGNOSIS — E46 Unspecified protein-calorie malnutrition: Secondary | ICD-10-CM | POA: Diagnosis not present

## 2017-07-08 DIAGNOSIS — G309 Alzheimer's disease, unspecified: Secondary | ICD-10-CM | POA: Diagnosis not present

## 2017-07-08 DIAGNOSIS — R634 Abnormal weight loss: Secondary | ICD-10-CM | POA: Diagnosis not present

## 2017-07-08 DIAGNOSIS — F339 Major depressive disorder, recurrent, unspecified: Secondary | ICD-10-CM | POA: Diagnosis not present

## 2017-07-09 DIAGNOSIS — G309 Alzheimer's disease, unspecified: Secondary | ICD-10-CM | POA: Diagnosis not present

## 2017-07-09 DIAGNOSIS — R634 Abnormal weight loss: Secondary | ICD-10-CM | POA: Diagnosis not present

## 2017-07-09 DIAGNOSIS — F339 Major depressive disorder, recurrent, unspecified: Secondary | ICD-10-CM | POA: Diagnosis not present

## 2017-07-09 DIAGNOSIS — E46 Unspecified protein-calorie malnutrition: Secondary | ICD-10-CM | POA: Diagnosis not present

## 2017-07-09 DIAGNOSIS — F411 Generalized anxiety disorder: Secondary | ICD-10-CM | POA: Diagnosis not present

## 2017-07-09 DIAGNOSIS — F0281 Dementia in other diseases classified elsewhere with behavioral disturbance: Secondary | ICD-10-CM | POA: Diagnosis not present

## 2017-07-10 DIAGNOSIS — R634 Abnormal weight loss: Secondary | ICD-10-CM | POA: Diagnosis not present

## 2017-07-10 DIAGNOSIS — F339 Major depressive disorder, recurrent, unspecified: Secondary | ICD-10-CM | POA: Diagnosis not present

## 2017-07-10 DIAGNOSIS — E46 Unspecified protein-calorie malnutrition: Secondary | ICD-10-CM | POA: Diagnosis not present

## 2017-07-10 DIAGNOSIS — G309 Alzheimer's disease, unspecified: Secondary | ICD-10-CM | POA: Diagnosis not present

## 2017-07-10 DIAGNOSIS — F0281 Dementia in other diseases classified elsewhere with behavioral disturbance: Secondary | ICD-10-CM | POA: Diagnosis not present

## 2017-07-10 DIAGNOSIS — F411 Generalized anxiety disorder: Secondary | ICD-10-CM | POA: Diagnosis not present

## 2017-07-11 DIAGNOSIS — F0281 Dementia in other diseases classified elsewhere with behavioral disturbance: Secondary | ICD-10-CM | POA: Diagnosis not present

## 2017-07-11 DIAGNOSIS — E46 Unspecified protein-calorie malnutrition: Secondary | ICD-10-CM | POA: Diagnosis not present

## 2017-07-11 DIAGNOSIS — F339 Major depressive disorder, recurrent, unspecified: Secondary | ICD-10-CM | POA: Diagnosis not present

## 2017-07-11 DIAGNOSIS — R634 Abnormal weight loss: Secondary | ICD-10-CM | POA: Diagnosis not present

## 2017-07-11 DIAGNOSIS — F411 Generalized anxiety disorder: Secondary | ICD-10-CM | POA: Diagnosis not present

## 2017-07-11 DIAGNOSIS — G309 Alzheimer's disease, unspecified: Secondary | ICD-10-CM | POA: Diagnosis not present

## 2017-07-14 DIAGNOSIS — E46 Unspecified protein-calorie malnutrition: Secondary | ICD-10-CM | POA: Diagnosis not present

## 2017-07-14 DIAGNOSIS — F339 Major depressive disorder, recurrent, unspecified: Secondary | ICD-10-CM | POA: Diagnosis not present

## 2017-07-14 DIAGNOSIS — F0281 Dementia in other diseases classified elsewhere with behavioral disturbance: Secondary | ICD-10-CM | POA: Diagnosis not present

## 2017-07-14 DIAGNOSIS — G309 Alzheimer's disease, unspecified: Secondary | ICD-10-CM | POA: Diagnosis not present

## 2017-07-14 DIAGNOSIS — R634 Abnormal weight loss: Secondary | ICD-10-CM | POA: Diagnosis not present

## 2017-07-14 DIAGNOSIS — F411 Generalized anxiety disorder: Secondary | ICD-10-CM | POA: Diagnosis not present

## 2017-07-15 DIAGNOSIS — R634 Abnormal weight loss: Secondary | ICD-10-CM | POA: Diagnosis not present

## 2017-07-15 DIAGNOSIS — F339 Major depressive disorder, recurrent, unspecified: Secondary | ICD-10-CM | POA: Diagnosis not present

## 2017-07-15 DIAGNOSIS — E46 Unspecified protein-calorie malnutrition: Secondary | ICD-10-CM | POA: Diagnosis not present

## 2017-07-15 DIAGNOSIS — G309 Alzheimer's disease, unspecified: Secondary | ICD-10-CM | POA: Diagnosis not present

## 2017-07-15 DIAGNOSIS — F411 Generalized anxiety disorder: Secondary | ICD-10-CM | POA: Diagnosis not present

## 2017-07-15 DIAGNOSIS — F0281 Dementia in other diseases classified elsewhere with behavioral disturbance: Secondary | ICD-10-CM | POA: Diagnosis not present

## 2017-07-17 DIAGNOSIS — F0281 Dementia in other diseases classified elsewhere with behavioral disturbance: Secondary | ICD-10-CM | POA: Diagnosis not present

## 2017-07-17 DIAGNOSIS — G309 Alzheimer's disease, unspecified: Secondary | ICD-10-CM | POA: Diagnosis not present

## 2017-07-17 DIAGNOSIS — E46 Unspecified protein-calorie malnutrition: Secondary | ICD-10-CM | POA: Diagnosis not present

## 2017-07-17 DIAGNOSIS — R634 Abnormal weight loss: Secondary | ICD-10-CM | POA: Diagnosis not present

## 2017-07-17 DIAGNOSIS — F339 Major depressive disorder, recurrent, unspecified: Secondary | ICD-10-CM | POA: Diagnosis not present

## 2017-07-17 DIAGNOSIS — F411 Generalized anxiety disorder: Secondary | ICD-10-CM | POA: Diagnosis not present

## 2017-07-18 DIAGNOSIS — E46 Unspecified protein-calorie malnutrition: Secondary | ICD-10-CM | POA: Diagnosis not present

## 2017-07-18 DIAGNOSIS — F0281 Dementia in other diseases classified elsewhere with behavioral disturbance: Secondary | ICD-10-CM | POA: Diagnosis not present

## 2017-07-18 DIAGNOSIS — G309 Alzheimer's disease, unspecified: Secondary | ICD-10-CM | POA: Diagnosis not present

## 2017-07-18 DIAGNOSIS — R634 Abnormal weight loss: Secondary | ICD-10-CM | POA: Diagnosis not present

## 2017-07-18 DIAGNOSIS — F411 Generalized anxiety disorder: Secondary | ICD-10-CM | POA: Diagnosis not present

## 2017-07-18 DIAGNOSIS — F339 Major depressive disorder, recurrent, unspecified: Secondary | ICD-10-CM | POA: Diagnosis not present

## 2017-07-19 DIAGNOSIS — F339 Major depressive disorder, recurrent, unspecified: Secondary | ICD-10-CM | POA: Diagnosis not present

## 2017-07-19 DIAGNOSIS — F411 Generalized anxiety disorder: Secondary | ICD-10-CM | POA: Diagnosis not present

## 2017-07-19 DIAGNOSIS — E46 Unspecified protein-calorie malnutrition: Secondary | ICD-10-CM | POA: Diagnosis not present

## 2017-07-19 DIAGNOSIS — R634 Abnormal weight loss: Secondary | ICD-10-CM | POA: Diagnosis not present

## 2017-07-19 DIAGNOSIS — F0281 Dementia in other diseases classified elsewhere with behavioral disturbance: Secondary | ICD-10-CM | POA: Diagnosis not present

## 2017-07-19 DIAGNOSIS — G309 Alzheimer's disease, unspecified: Secondary | ICD-10-CM | POA: Diagnosis not present

## 2017-07-22 DIAGNOSIS — F411 Generalized anxiety disorder: Secondary | ICD-10-CM | POA: Diagnosis not present

## 2017-07-22 DIAGNOSIS — G309 Alzheimer's disease, unspecified: Secondary | ICD-10-CM | POA: Diagnosis not present

## 2017-07-22 DIAGNOSIS — F0281 Dementia in other diseases classified elsewhere with behavioral disturbance: Secondary | ICD-10-CM | POA: Diagnosis not present

## 2017-07-22 DIAGNOSIS — E46 Unspecified protein-calorie malnutrition: Secondary | ICD-10-CM | POA: Diagnosis not present

## 2017-07-22 DIAGNOSIS — R634 Abnormal weight loss: Secondary | ICD-10-CM | POA: Diagnosis not present

## 2017-07-22 DIAGNOSIS — F339 Major depressive disorder, recurrent, unspecified: Secondary | ICD-10-CM | POA: Diagnosis not present

## 2017-07-24 DIAGNOSIS — E46 Unspecified protein-calorie malnutrition: Secondary | ICD-10-CM | POA: Diagnosis not present

## 2017-07-24 DIAGNOSIS — F0281 Dementia in other diseases classified elsewhere with behavioral disturbance: Secondary | ICD-10-CM | POA: Diagnosis not present

## 2017-07-24 DIAGNOSIS — F339 Major depressive disorder, recurrent, unspecified: Secondary | ICD-10-CM | POA: Diagnosis not present

## 2017-07-24 DIAGNOSIS — F411 Generalized anxiety disorder: Secondary | ICD-10-CM | POA: Diagnosis not present

## 2017-07-24 DIAGNOSIS — G309 Alzheimer's disease, unspecified: Secondary | ICD-10-CM | POA: Diagnosis not present

## 2017-07-24 DIAGNOSIS — R634 Abnormal weight loss: Secondary | ICD-10-CM | POA: Diagnosis not present

## 2017-07-25 DIAGNOSIS — F411 Generalized anxiety disorder: Secondary | ICD-10-CM | POA: Diagnosis not present

## 2017-07-25 DIAGNOSIS — F339 Major depressive disorder, recurrent, unspecified: Secondary | ICD-10-CM | POA: Diagnosis not present

## 2017-07-25 DIAGNOSIS — R634 Abnormal weight loss: Secondary | ICD-10-CM | POA: Diagnosis not present

## 2017-07-25 DIAGNOSIS — G309 Alzheimer's disease, unspecified: Secondary | ICD-10-CM | POA: Diagnosis not present

## 2017-07-25 DIAGNOSIS — E46 Unspecified protein-calorie malnutrition: Secondary | ICD-10-CM | POA: Diagnosis not present

## 2017-07-25 DIAGNOSIS — F0281 Dementia in other diseases classified elsewhere with behavioral disturbance: Secondary | ICD-10-CM | POA: Diagnosis not present

## 2017-07-29 DIAGNOSIS — F339 Major depressive disorder, recurrent, unspecified: Secondary | ICD-10-CM | POA: Diagnosis not present

## 2017-07-29 DIAGNOSIS — E46 Unspecified protein-calorie malnutrition: Secondary | ICD-10-CM | POA: Diagnosis not present

## 2017-07-29 DIAGNOSIS — F411 Generalized anxiety disorder: Secondary | ICD-10-CM | POA: Diagnosis not present

## 2017-07-29 DIAGNOSIS — R634 Abnormal weight loss: Secondary | ICD-10-CM | POA: Diagnosis not present

## 2017-07-29 DIAGNOSIS — G309 Alzheimer's disease, unspecified: Secondary | ICD-10-CM | POA: Diagnosis not present

## 2017-07-29 DIAGNOSIS — F0281 Dementia in other diseases classified elsewhere with behavioral disturbance: Secondary | ICD-10-CM | POA: Diagnosis not present

## 2017-07-31 DIAGNOSIS — G309 Alzheimer's disease, unspecified: Secondary | ICD-10-CM | POA: Diagnosis not present

## 2017-07-31 DIAGNOSIS — F339 Major depressive disorder, recurrent, unspecified: Secondary | ICD-10-CM | POA: Diagnosis not present

## 2017-07-31 DIAGNOSIS — F0281 Dementia in other diseases classified elsewhere with behavioral disturbance: Secondary | ICD-10-CM | POA: Diagnosis not present

## 2017-07-31 DIAGNOSIS — F411 Generalized anxiety disorder: Secondary | ICD-10-CM | POA: Diagnosis not present

## 2017-07-31 DIAGNOSIS — R634 Abnormal weight loss: Secondary | ICD-10-CM | POA: Diagnosis not present

## 2017-07-31 DIAGNOSIS — E46 Unspecified protein-calorie malnutrition: Secondary | ICD-10-CM | POA: Diagnosis not present

## 2017-08-03 DIAGNOSIS — F411 Generalized anxiety disorder: Secondary | ICD-10-CM | POA: Diagnosis not present

## 2017-08-03 DIAGNOSIS — G309 Alzheimer's disease, unspecified: Secondary | ICD-10-CM | POA: Diagnosis not present

## 2017-08-03 DIAGNOSIS — R634 Abnormal weight loss: Secondary | ICD-10-CM | POA: Diagnosis not present

## 2017-08-03 DIAGNOSIS — F0281 Dementia in other diseases classified elsewhere with behavioral disturbance: Secondary | ICD-10-CM | POA: Diagnosis not present

## 2017-08-03 DIAGNOSIS — E46 Unspecified protein-calorie malnutrition: Secondary | ICD-10-CM | POA: Diagnosis not present

## 2017-08-03 DIAGNOSIS — F339 Major depressive disorder, recurrent, unspecified: Secondary | ICD-10-CM | POA: Diagnosis not present

## 2017-08-04 DIAGNOSIS — R634 Abnormal weight loss: Secondary | ICD-10-CM | POA: Diagnosis not present

## 2017-08-04 DIAGNOSIS — G309 Alzheimer's disease, unspecified: Secondary | ICD-10-CM | POA: Diagnosis not present

## 2017-08-04 DIAGNOSIS — F411 Generalized anxiety disorder: Secondary | ICD-10-CM | POA: Diagnosis not present

## 2017-08-04 DIAGNOSIS — E46 Unspecified protein-calorie malnutrition: Secondary | ICD-10-CM | POA: Diagnosis not present

## 2017-08-04 DIAGNOSIS — F339 Major depressive disorder, recurrent, unspecified: Secondary | ICD-10-CM | POA: Diagnosis not present

## 2017-08-04 DIAGNOSIS — F0281 Dementia in other diseases classified elsewhere with behavioral disturbance: Secondary | ICD-10-CM | POA: Diagnosis not present

## 2017-08-05 DIAGNOSIS — F411 Generalized anxiety disorder: Secondary | ICD-10-CM | POA: Diagnosis not present

## 2017-08-05 DIAGNOSIS — F0281 Dementia in other diseases classified elsewhere with behavioral disturbance: Secondary | ICD-10-CM | POA: Diagnosis not present

## 2017-08-05 DIAGNOSIS — E46 Unspecified protein-calorie malnutrition: Secondary | ICD-10-CM | POA: Diagnosis not present

## 2017-08-05 DIAGNOSIS — G309 Alzheimer's disease, unspecified: Secondary | ICD-10-CM | POA: Diagnosis not present

## 2017-08-05 DIAGNOSIS — R634 Abnormal weight loss: Secondary | ICD-10-CM | POA: Diagnosis not present

## 2017-08-05 DIAGNOSIS — F339 Major depressive disorder, recurrent, unspecified: Secondary | ICD-10-CM | POA: Diagnosis not present

## 2017-08-06 DIAGNOSIS — G309 Alzheimer's disease, unspecified: Secondary | ICD-10-CM | POA: Diagnosis not present

## 2017-08-06 DIAGNOSIS — F339 Major depressive disorder, recurrent, unspecified: Secondary | ICD-10-CM | POA: Diagnosis not present

## 2017-08-06 DIAGNOSIS — R634 Abnormal weight loss: Secondary | ICD-10-CM | POA: Diagnosis not present

## 2017-08-06 DIAGNOSIS — F411 Generalized anxiety disorder: Secondary | ICD-10-CM | POA: Diagnosis not present

## 2017-08-06 DIAGNOSIS — E46 Unspecified protein-calorie malnutrition: Secondary | ICD-10-CM | POA: Diagnosis not present

## 2017-08-06 DIAGNOSIS — F0281 Dementia in other diseases classified elsewhere with behavioral disturbance: Secondary | ICD-10-CM | POA: Diagnosis not present

## 2017-08-07 DIAGNOSIS — F411 Generalized anxiety disorder: Secondary | ICD-10-CM | POA: Diagnosis not present

## 2017-08-07 DIAGNOSIS — E46 Unspecified protein-calorie malnutrition: Secondary | ICD-10-CM | POA: Diagnosis not present

## 2017-08-07 DIAGNOSIS — G309 Alzheimer's disease, unspecified: Secondary | ICD-10-CM | POA: Diagnosis not present

## 2017-08-07 DIAGNOSIS — R634 Abnormal weight loss: Secondary | ICD-10-CM | POA: Diagnosis not present

## 2017-08-07 DIAGNOSIS — F339 Major depressive disorder, recurrent, unspecified: Secondary | ICD-10-CM | POA: Diagnosis not present

## 2017-08-07 DIAGNOSIS — F0281 Dementia in other diseases classified elsewhere with behavioral disturbance: Secondary | ICD-10-CM | POA: Diagnosis not present

## 2017-08-08 DIAGNOSIS — E46 Unspecified protein-calorie malnutrition: Secondary | ICD-10-CM | POA: Diagnosis not present

## 2017-08-08 DIAGNOSIS — R634 Abnormal weight loss: Secondary | ICD-10-CM | POA: Diagnosis not present

## 2017-08-08 DIAGNOSIS — F411 Generalized anxiety disorder: Secondary | ICD-10-CM | POA: Diagnosis not present

## 2017-08-08 DIAGNOSIS — G309 Alzheimer's disease, unspecified: Secondary | ICD-10-CM | POA: Diagnosis not present

## 2017-08-08 DIAGNOSIS — F0281 Dementia in other diseases classified elsewhere with behavioral disturbance: Secondary | ICD-10-CM | POA: Diagnosis not present

## 2017-08-08 DIAGNOSIS — F339 Major depressive disorder, recurrent, unspecified: Secondary | ICD-10-CM | POA: Diagnosis not present

## 2017-08-11 DIAGNOSIS — F0281 Dementia in other diseases classified elsewhere with behavioral disturbance: Secondary | ICD-10-CM | POA: Diagnosis not present

## 2017-08-11 DIAGNOSIS — R634 Abnormal weight loss: Secondary | ICD-10-CM | POA: Diagnosis not present

## 2017-08-11 DIAGNOSIS — F339 Major depressive disorder, recurrent, unspecified: Secondary | ICD-10-CM | POA: Diagnosis not present

## 2017-08-11 DIAGNOSIS — F411 Generalized anxiety disorder: Secondary | ICD-10-CM | POA: Diagnosis not present

## 2017-08-11 DIAGNOSIS — E46 Unspecified protein-calorie malnutrition: Secondary | ICD-10-CM | POA: Diagnosis not present

## 2017-08-11 DIAGNOSIS — G309 Alzheimer's disease, unspecified: Secondary | ICD-10-CM | POA: Diagnosis not present

## 2017-08-12 DIAGNOSIS — F339 Major depressive disorder, recurrent, unspecified: Secondary | ICD-10-CM | POA: Diagnosis not present

## 2017-08-12 DIAGNOSIS — E46 Unspecified protein-calorie malnutrition: Secondary | ICD-10-CM | POA: Diagnosis not present

## 2017-08-12 DIAGNOSIS — F411 Generalized anxiety disorder: Secondary | ICD-10-CM | POA: Diagnosis not present

## 2017-08-12 DIAGNOSIS — G309 Alzheimer's disease, unspecified: Secondary | ICD-10-CM | POA: Diagnosis not present

## 2017-08-12 DIAGNOSIS — R634 Abnormal weight loss: Secondary | ICD-10-CM | POA: Diagnosis not present

## 2017-08-12 DIAGNOSIS — F0281 Dementia in other diseases classified elsewhere with behavioral disturbance: Secondary | ICD-10-CM | POA: Diagnosis not present

## 2017-08-14 DIAGNOSIS — F339 Major depressive disorder, recurrent, unspecified: Secondary | ICD-10-CM | POA: Diagnosis not present

## 2017-08-14 DIAGNOSIS — R634 Abnormal weight loss: Secondary | ICD-10-CM | POA: Diagnosis not present

## 2017-08-14 DIAGNOSIS — F0281 Dementia in other diseases classified elsewhere with behavioral disturbance: Secondary | ICD-10-CM | POA: Diagnosis not present

## 2017-08-14 DIAGNOSIS — E46 Unspecified protein-calorie malnutrition: Secondary | ICD-10-CM | POA: Diagnosis not present

## 2017-08-14 DIAGNOSIS — G309 Alzheimer's disease, unspecified: Secondary | ICD-10-CM | POA: Diagnosis not present

## 2017-08-14 DIAGNOSIS — F411 Generalized anxiety disorder: Secondary | ICD-10-CM | POA: Diagnosis not present

## 2017-08-18 DIAGNOSIS — R634 Abnormal weight loss: Secondary | ICD-10-CM | POA: Diagnosis not present

## 2017-08-18 DIAGNOSIS — E46 Unspecified protein-calorie malnutrition: Secondary | ICD-10-CM | POA: Diagnosis not present

## 2017-08-18 DIAGNOSIS — F0281 Dementia in other diseases classified elsewhere with behavioral disturbance: Secondary | ICD-10-CM | POA: Diagnosis not present

## 2017-08-18 DIAGNOSIS — F339 Major depressive disorder, recurrent, unspecified: Secondary | ICD-10-CM | POA: Diagnosis not present

## 2017-08-18 DIAGNOSIS — F411 Generalized anxiety disorder: Secondary | ICD-10-CM | POA: Diagnosis not present

## 2017-08-18 DIAGNOSIS — G309 Alzheimer's disease, unspecified: Secondary | ICD-10-CM | POA: Diagnosis not present

## 2017-08-19 DIAGNOSIS — E46 Unspecified protein-calorie malnutrition: Secondary | ICD-10-CM | POA: Diagnosis not present

## 2017-08-19 DIAGNOSIS — F339 Major depressive disorder, recurrent, unspecified: Secondary | ICD-10-CM | POA: Diagnosis not present

## 2017-08-19 DIAGNOSIS — F411 Generalized anxiety disorder: Secondary | ICD-10-CM | POA: Diagnosis not present

## 2017-08-19 DIAGNOSIS — F0281 Dementia in other diseases classified elsewhere with behavioral disturbance: Secondary | ICD-10-CM | POA: Diagnosis not present

## 2017-08-19 DIAGNOSIS — G309 Alzheimer's disease, unspecified: Secondary | ICD-10-CM | POA: Diagnosis not present

## 2017-08-19 DIAGNOSIS — R634 Abnormal weight loss: Secondary | ICD-10-CM | POA: Diagnosis not present

## 2017-08-20 DIAGNOSIS — E46 Unspecified protein-calorie malnutrition: Secondary | ICD-10-CM | POA: Diagnosis not present

## 2017-08-20 DIAGNOSIS — F339 Major depressive disorder, recurrent, unspecified: Secondary | ICD-10-CM | POA: Diagnosis not present

## 2017-08-20 DIAGNOSIS — R634 Abnormal weight loss: Secondary | ICD-10-CM | POA: Diagnosis not present

## 2017-08-20 DIAGNOSIS — G309 Alzheimer's disease, unspecified: Secondary | ICD-10-CM | POA: Diagnosis not present

## 2017-08-20 DIAGNOSIS — F0281 Dementia in other diseases classified elsewhere with behavioral disturbance: Secondary | ICD-10-CM | POA: Diagnosis not present

## 2017-08-20 DIAGNOSIS — F411 Generalized anxiety disorder: Secondary | ICD-10-CM | POA: Diagnosis not present

## 2017-08-21 DIAGNOSIS — F0281 Dementia in other diseases classified elsewhere with behavioral disturbance: Secondary | ICD-10-CM | POA: Diagnosis not present

## 2017-08-21 DIAGNOSIS — E46 Unspecified protein-calorie malnutrition: Secondary | ICD-10-CM | POA: Diagnosis not present

## 2017-08-21 DIAGNOSIS — G309 Alzheimer's disease, unspecified: Secondary | ICD-10-CM | POA: Diagnosis not present

## 2017-08-21 DIAGNOSIS — F339 Major depressive disorder, recurrent, unspecified: Secondary | ICD-10-CM | POA: Diagnosis not present

## 2017-08-21 DIAGNOSIS — F411 Generalized anxiety disorder: Secondary | ICD-10-CM | POA: Diagnosis not present

## 2017-08-21 DIAGNOSIS — R634 Abnormal weight loss: Secondary | ICD-10-CM | POA: Diagnosis not present

## 2017-08-22 DIAGNOSIS — E46 Unspecified protein-calorie malnutrition: Secondary | ICD-10-CM | POA: Diagnosis not present

## 2017-08-22 DIAGNOSIS — R634 Abnormal weight loss: Secondary | ICD-10-CM | POA: Diagnosis not present

## 2017-08-22 DIAGNOSIS — F0281 Dementia in other diseases classified elsewhere with behavioral disturbance: Secondary | ICD-10-CM | POA: Diagnosis not present

## 2017-08-22 DIAGNOSIS — F339 Major depressive disorder, recurrent, unspecified: Secondary | ICD-10-CM | POA: Diagnosis not present

## 2017-08-22 DIAGNOSIS — G309 Alzheimer's disease, unspecified: Secondary | ICD-10-CM | POA: Diagnosis not present

## 2017-08-22 DIAGNOSIS — F411 Generalized anxiety disorder: Secondary | ICD-10-CM | POA: Diagnosis not present

## 2017-08-24 DIAGNOSIS — G309 Alzheimer's disease, unspecified: Secondary | ICD-10-CM | POA: Diagnosis not present

## 2017-08-24 DIAGNOSIS — F0281 Dementia in other diseases classified elsewhere with behavioral disturbance: Secondary | ICD-10-CM | POA: Diagnosis not present

## 2017-08-24 DIAGNOSIS — F411 Generalized anxiety disorder: Secondary | ICD-10-CM | POA: Diagnosis not present

## 2017-08-24 DIAGNOSIS — E46 Unspecified protein-calorie malnutrition: Secondary | ICD-10-CM | POA: Diagnosis not present

## 2017-08-24 DIAGNOSIS — F339 Major depressive disorder, recurrent, unspecified: Secondary | ICD-10-CM | POA: Diagnosis not present

## 2017-08-24 DIAGNOSIS — R634 Abnormal weight loss: Secondary | ICD-10-CM | POA: Diagnosis not present

## 2017-08-25 DIAGNOSIS — F0281 Dementia in other diseases classified elsewhere with behavioral disturbance: Secondary | ICD-10-CM | POA: Diagnosis not present

## 2017-08-25 DIAGNOSIS — F339 Major depressive disorder, recurrent, unspecified: Secondary | ICD-10-CM | POA: Diagnosis not present

## 2017-08-25 DIAGNOSIS — E46 Unspecified protein-calorie malnutrition: Secondary | ICD-10-CM | POA: Diagnosis not present

## 2017-08-25 DIAGNOSIS — G309 Alzheimer's disease, unspecified: Secondary | ICD-10-CM | POA: Diagnosis not present

## 2017-08-25 DIAGNOSIS — F411 Generalized anxiety disorder: Secondary | ICD-10-CM | POA: Diagnosis not present

## 2017-08-25 DIAGNOSIS — R634 Abnormal weight loss: Secondary | ICD-10-CM | POA: Diagnosis not present

## 2017-08-26 DIAGNOSIS — E46 Unspecified protein-calorie malnutrition: Secondary | ICD-10-CM | POA: Diagnosis not present

## 2017-08-26 DIAGNOSIS — F0281 Dementia in other diseases classified elsewhere with behavioral disturbance: Secondary | ICD-10-CM | POA: Diagnosis not present

## 2017-08-26 DIAGNOSIS — R634 Abnormal weight loss: Secondary | ICD-10-CM | POA: Diagnosis not present

## 2017-08-26 DIAGNOSIS — F411 Generalized anxiety disorder: Secondary | ICD-10-CM | POA: Diagnosis not present

## 2017-08-26 DIAGNOSIS — F339 Major depressive disorder, recurrent, unspecified: Secondary | ICD-10-CM | POA: Diagnosis not present

## 2017-08-26 DIAGNOSIS — G309 Alzheimer's disease, unspecified: Secondary | ICD-10-CM | POA: Diagnosis not present

## 2017-08-28 DIAGNOSIS — F0281 Dementia in other diseases classified elsewhere with behavioral disturbance: Secondary | ICD-10-CM | POA: Diagnosis not present

## 2017-08-28 DIAGNOSIS — R634 Abnormal weight loss: Secondary | ICD-10-CM | POA: Diagnosis not present

## 2017-08-28 DIAGNOSIS — F339 Major depressive disorder, recurrent, unspecified: Secondary | ICD-10-CM | POA: Diagnosis not present

## 2017-08-28 DIAGNOSIS — F411 Generalized anxiety disorder: Secondary | ICD-10-CM | POA: Diagnosis not present

## 2017-08-28 DIAGNOSIS — E46 Unspecified protein-calorie malnutrition: Secondary | ICD-10-CM | POA: Diagnosis not present

## 2017-08-28 DIAGNOSIS — G309 Alzheimer's disease, unspecified: Secondary | ICD-10-CM | POA: Diagnosis not present

## 2017-08-29 DIAGNOSIS — F411 Generalized anxiety disorder: Secondary | ICD-10-CM | POA: Diagnosis not present

## 2017-08-29 DIAGNOSIS — F339 Major depressive disorder, recurrent, unspecified: Secondary | ICD-10-CM | POA: Diagnosis not present

## 2017-08-29 DIAGNOSIS — R634 Abnormal weight loss: Secondary | ICD-10-CM | POA: Diagnosis not present

## 2017-08-29 DIAGNOSIS — G309 Alzheimer's disease, unspecified: Secondary | ICD-10-CM | POA: Diagnosis not present

## 2017-08-29 DIAGNOSIS — E46 Unspecified protein-calorie malnutrition: Secondary | ICD-10-CM | POA: Diagnosis not present

## 2017-08-29 DIAGNOSIS — F0281 Dementia in other diseases classified elsewhere with behavioral disturbance: Secondary | ICD-10-CM | POA: Diagnosis not present

## 2017-08-31 DIAGNOSIS — R634 Abnormal weight loss: Secondary | ICD-10-CM | POA: Diagnosis not present

## 2017-08-31 DIAGNOSIS — E46 Unspecified protein-calorie malnutrition: Secondary | ICD-10-CM | POA: Diagnosis not present

## 2017-08-31 DIAGNOSIS — F411 Generalized anxiety disorder: Secondary | ICD-10-CM | POA: Diagnosis not present

## 2017-08-31 DIAGNOSIS — F339 Major depressive disorder, recurrent, unspecified: Secondary | ICD-10-CM | POA: Diagnosis not present

## 2017-08-31 DIAGNOSIS — G309 Alzheimer's disease, unspecified: Secondary | ICD-10-CM | POA: Diagnosis not present

## 2017-08-31 DIAGNOSIS — F0281 Dementia in other diseases classified elsewhere with behavioral disturbance: Secondary | ICD-10-CM | POA: Diagnosis not present

## 2017-09-01 DIAGNOSIS — G309 Alzheimer's disease, unspecified: Secondary | ICD-10-CM | POA: Diagnosis not present

## 2017-09-01 DIAGNOSIS — F0281 Dementia in other diseases classified elsewhere with behavioral disturbance: Secondary | ICD-10-CM | POA: Diagnosis not present

## 2017-09-01 DIAGNOSIS — R634 Abnormal weight loss: Secondary | ICD-10-CM | POA: Diagnosis not present

## 2017-09-01 DIAGNOSIS — F339 Major depressive disorder, recurrent, unspecified: Secondary | ICD-10-CM | POA: Diagnosis not present

## 2017-09-01 DIAGNOSIS — E46 Unspecified protein-calorie malnutrition: Secondary | ICD-10-CM | POA: Diagnosis not present

## 2017-09-01 DIAGNOSIS — F411 Generalized anxiety disorder: Secondary | ICD-10-CM | POA: Diagnosis not present

## 2017-09-02 DIAGNOSIS — F0281 Dementia in other diseases classified elsewhere with behavioral disturbance: Secondary | ICD-10-CM | POA: Diagnosis not present

## 2017-09-02 DIAGNOSIS — G309 Alzheimer's disease, unspecified: Secondary | ICD-10-CM | POA: Diagnosis not present

## 2017-09-02 DIAGNOSIS — R634 Abnormal weight loss: Secondary | ICD-10-CM | POA: Diagnosis not present

## 2017-09-02 DIAGNOSIS — F339 Major depressive disorder, recurrent, unspecified: Secondary | ICD-10-CM | POA: Diagnosis not present

## 2017-09-02 DIAGNOSIS — E46 Unspecified protein-calorie malnutrition: Secondary | ICD-10-CM | POA: Diagnosis not present

## 2017-09-02 DIAGNOSIS — F411 Generalized anxiety disorder: Secondary | ICD-10-CM | POA: Diagnosis not present

## 2017-09-04 DIAGNOSIS — F0281 Dementia in other diseases classified elsewhere with behavioral disturbance: Secondary | ICD-10-CM | POA: Diagnosis not present

## 2017-09-04 DIAGNOSIS — R634 Abnormal weight loss: Secondary | ICD-10-CM | POA: Diagnosis not present

## 2017-09-04 DIAGNOSIS — E46 Unspecified protein-calorie malnutrition: Secondary | ICD-10-CM | POA: Diagnosis not present

## 2017-09-04 DIAGNOSIS — F411 Generalized anxiety disorder: Secondary | ICD-10-CM | POA: Diagnosis not present

## 2017-09-04 DIAGNOSIS — G309 Alzheimer's disease, unspecified: Secondary | ICD-10-CM | POA: Diagnosis not present

## 2017-09-04 DIAGNOSIS — F339 Major depressive disorder, recurrent, unspecified: Secondary | ICD-10-CM | POA: Diagnosis not present

## 2017-09-08 DIAGNOSIS — F339 Major depressive disorder, recurrent, unspecified: Secondary | ICD-10-CM | POA: Diagnosis not present

## 2017-09-08 DIAGNOSIS — G309 Alzheimer's disease, unspecified: Secondary | ICD-10-CM | POA: Diagnosis not present

## 2017-09-08 DIAGNOSIS — R634 Abnormal weight loss: Secondary | ICD-10-CM | POA: Diagnosis not present

## 2017-09-08 DIAGNOSIS — F0281 Dementia in other diseases classified elsewhere with behavioral disturbance: Secondary | ICD-10-CM | POA: Diagnosis not present

## 2017-09-08 DIAGNOSIS — E46 Unspecified protein-calorie malnutrition: Secondary | ICD-10-CM | POA: Diagnosis not present

## 2017-09-08 DIAGNOSIS — F411 Generalized anxiety disorder: Secondary | ICD-10-CM | POA: Diagnosis not present

## 2017-09-09 DIAGNOSIS — E46 Unspecified protein-calorie malnutrition: Secondary | ICD-10-CM | POA: Diagnosis not present

## 2017-09-09 DIAGNOSIS — F411 Generalized anxiety disorder: Secondary | ICD-10-CM | POA: Diagnosis not present

## 2017-09-09 DIAGNOSIS — R634 Abnormal weight loss: Secondary | ICD-10-CM | POA: Diagnosis not present

## 2017-09-09 DIAGNOSIS — G309 Alzheimer's disease, unspecified: Secondary | ICD-10-CM | POA: Diagnosis not present

## 2017-09-09 DIAGNOSIS — F0281 Dementia in other diseases classified elsewhere with behavioral disturbance: Secondary | ICD-10-CM | POA: Diagnosis not present

## 2017-09-09 DIAGNOSIS — F339 Major depressive disorder, recurrent, unspecified: Secondary | ICD-10-CM | POA: Diagnosis not present

## 2017-09-10 DIAGNOSIS — F339 Major depressive disorder, recurrent, unspecified: Secondary | ICD-10-CM | POA: Diagnosis not present

## 2017-09-10 DIAGNOSIS — G309 Alzheimer's disease, unspecified: Secondary | ICD-10-CM | POA: Diagnosis not present

## 2017-09-10 DIAGNOSIS — E46 Unspecified protein-calorie malnutrition: Secondary | ICD-10-CM | POA: Diagnosis not present

## 2017-09-10 DIAGNOSIS — F0281 Dementia in other diseases classified elsewhere with behavioral disturbance: Secondary | ICD-10-CM | POA: Diagnosis not present

## 2017-09-10 DIAGNOSIS — R634 Abnormal weight loss: Secondary | ICD-10-CM | POA: Diagnosis not present

## 2017-09-10 DIAGNOSIS — F411 Generalized anxiety disorder: Secondary | ICD-10-CM | POA: Diagnosis not present

## 2017-09-12 DIAGNOSIS — F339 Major depressive disorder, recurrent, unspecified: Secondary | ICD-10-CM | POA: Diagnosis not present

## 2017-09-12 DIAGNOSIS — F0281 Dementia in other diseases classified elsewhere with behavioral disturbance: Secondary | ICD-10-CM | POA: Diagnosis not present

## 2017-09-12 DIAGNOSIS — E46 Unspecified protein-calorie malnutrition: Secondary | ICD-10-CM | POA: Diagnosis not present

## 2017-09-12 DIAGNOSIS — R634 Abnormal weight loss: Secondary | ICD-10-CM | POA: Diagnosis not present

## 2017-09-12 DIAGNOSIS — G309 Alzheimer's disease, unspecified: Secondary | ICD-10-CM | POA: Diagnosis not present

## 2017-09-12 DIAGNOSIS — F411 Generalized anxiety disorder: Secondary | ICD-10-CM | POA: Diagnosis not present

## 2017-09-13 DIAGNOSIS — E46 Unspecified protein-calorie malnutrition: Secondary | ICD-10-CM | POA: Diagnosis not present

## 2017-09-13 DIAGNOSIS — F411 Generalized anxiety disorder: Secondary | ICD-10-CM | POA: Diagnosis not present

## 2017-09-13 DIAGNOSIS — G309 Alzheimer's disease, unspecified: Secondary | ICD-10-CM | POA: Diagnosis not present

## 2017-09-13 DIAGNOSIS — F0281 Dementia in other diseases classified elsewhere with behavioral disturbance: Secondary | ICD-10-CM | POA: Diagnosis not present

## 2017-09-13 DIAGNOSIS — R634 Abnormal weight loss: Secondary | ICD-10-CM | POA: Diagnosis not present

## 2017-09-13 DIAGNOSIS — F339 Major depressive disorder, recurrent, unspecified: Secondary | ICD-10-CM | POA: Diagnosis not present

## 2017-09-15 DIAGNOSIS — F0281 Dementia in other diseases classified elsewhere with behavioral disturbance: Secondary | ICD-10-CM | POA: Diagnosis not present

## 2017-09-15 DIAGNOSIS — F411 Generalized anxiety disorder: Secondary | ICD-10-CM | POA: Diagnosis not present

## 2017-09-15 DIAGNOSIS — F339 Major depressive disorder, recurrent, unspecified: Secondary | ICD-10-CM | POA: Diagnosis not present

## 2017-09-15 DIAGNOSIS — R634 Abnormal weight loss: Secondary | ICD-10-CM | POA: Diagnosis not present

## 2017-09-15 DIAGNOSIS — G309 Alzheimer's disease, unspecified: Secondary | ICD-10-CM | POA: Diagnosis not present

## 2017-09-15 DIAGNOSIS — E46 Unspecified protein-calorie malnutrition: Secondary | ICD-10-CM | POA: Diagnosis not present

## 2017-09-16 DIAGNOSIS — E46 Unspecified protein-calorie malnutrition: Secondary | ICD-10-CM | POA: Diagnosis not present

## 2017-09-16 DIAGNOSIS — F0281 Dementia in other diseases classified elsewhere with behavioral disturbance: Secondary | ICD-10-CM | POA: Diagnosis not present

## 2017-09-16 DIAGNOSIS — G309 Alzheimer's disease, unspecified: Secondary | ICD-10-CM | POA: Diagnosis not present

## 2017-09-16 DIAGNOSIS — F339 Major depressive disorder, recurrent, unspecified: Secondary | ICD-10-CM | POA: Diagnosis not present

## 2017-09-16 DIAGNOSIS — F411 Generalized anxiety disorder: Secondary | ICD-10-CM | POA: Diagnosis not present

## 2017-09-16 DIAGNOSIS — R634 Abnormal weight loss: Secondary | ICD-10-CM | POA: Diagnosis not present

## 2017-09-17 DIAGNOSIS — F411 Generalized anxiety disorder: Secondary | ICD-10-CM | POA: Diagnosis not present

## 2017-09-17 DIAGNOSIS — G309 Alzheimer's disease, unspecified: Secondary | ICD-10-CM | POA: Diagnosis not present

## 2017-09-17 DIAGNOSIS — F0281 Dementia in other diseases classified elsewhere with behavioral disturbance: Secondary | ICD-10-CM | POA: Diagnosis not present

## 2017-09-17 DIAGNOSIS — R634 Abnormal weight loss: Secondary | ICD-10-CM | POA: Diagnosis not present

## 2017-09-17 DIAGNOSIS — F339 Major depressive disorder, recurrent, unspecified: Secondary | ICD-10-CM | POA: Diagnosis not present

## 2017-09-17 DIAGNOSIS — E46 Unspecified protein-calorie malnutrition: Secondary | ICD-10-CM | POA: Diagnosis not present

## 2017-09-18 DIAGNOSIS — E46 Unspecified protein-calorie malnutrition: Secondary | ICD-10-CM | POA: Diagnosis not present

## 2017-09-18 DIAGNOSIS — F0281 Dementia in other diseases classified elsewhere with behavioral disturbance: Secondary | ICD-10-CM | POA: Diagnosis not present

## 2017-09-18 DIAGNOSIS — F411 Generalized anxiety disorder: Secondary | ICD-10-CM | POA: Diagnosis not present

## 2017-09-18 DIAGNOSIS — F339 Major depressive disorder, recurrent, unspecified: Secondary | ICD-10-CM | POA: Diagnosis not present

## 2017-09-18 DIAGNOSIS — R634 Abnormal weight loss: Secondary | ICD-10-CM | POA: Diagnosis not present

## 2017-09-18 DIAGNOSIS — G309 Alzheimer's disease, unspecified: Secondary | ICD-10-CM | POA: Diagnosis not present

## 2017-09-19 DIAGNOSIS — F411 Generalized anxiety disorder: Secondary | ICD-10-CM | POA: Diagnosis not present

## 2017-09-19 DIAGNOSIS — E46 Unspecified protein-calorie malnutrition: Secondary | ICD-10-CM | POA: Diagnosis not present

## 2017-09-19 DIAGNOSIS — G309 Alzheimer's disease, unspecified: Secondary | ICD-10-CM | POA: Diagnosis not present

## 2017-09-19 DIAGNOSIS — F0281 Dementia in other diseases classified elsewhere with behavioral disturbance: Secondary | ICD-10-CM | POA: Diagnosis not present

## 2017-09-19 DIAGNOSIS — R634 Abnormal weight loss: Secondary | ICD-10-CM | POA: Diagnosis not present

## 2017-09-19 DIAGNOSIS — F339 Major depressive disorder, recurrent, unspecified: Secondary | ICD-10-CM | POA: Diagnosis not present

## 2017-09-22 DIAGNOSIS — G309 Alzheimer's disease, unspecified: Secondary | ICD-10-CM | POA: Diagnosis not present

## 2017-09-22 DIAGNOSIS — R634 Abnormal weight loss: Secondary | ICD-10-CM | POA: Diagnosis not present

## 2017-09-22 DIAGNOSIS — E46 Unspecified protein-calorie malnutrition: Secondary | ICD-10-CM | POA: Diagnosis not present

## 2017-09-22 DIAGNOSIS — F411 Generalized anxiety disorder: Secondary | ICD-10-CM | POA: Diagnosis not present

## 2017-09-22 DIAGNOSIS — F0281 Dementia in other diseases classified elsewhere with behavioral disturbance: Secondary | ICD-10-CM | POA: Diagnosis not present

## 2017-09-22 DIAGNOSIS — F339 Major depressive disorder, recurrent, unspecified: Secondary | ICD-10-CM | POA: Diagnosis not present

## 2017-09-23 DIAGNOSIS — E46 Unspecified protein-calorie malnutrition: Secondary | ICD-10-CM | POA: Diagnosis not present

## 2017-09-23 DIAGNOSIS — F411 Generalized anxiety disorder: Secondary | ICD-10-CM | POA: Diagnosis not present

## 2017-09-23 DIAGNOSIS — F339 Major depressive disorder, recurrent, unspecified: Secondary | ICD-10-CM | POA: Diagnosis not present

## 2017-09-23 DIAGNOSIS — R634 Abnormal weight loss: Secondary | ICD-10-CM | POA: Diagnosis not present

## 2017-09-23 DIAGNOSIS — G309 Alzheimer's disease, unspecified: Secondary | ICD-10-CM | POA: Diagnosis not present

## 2017-09-23 DIAGNOSIS — F0281 Dementia in other diseases classified elsewhere with behavioral disturbance: Secondary | ICD-10-CM | POA: Diagnosis not present

## 2017-09-25 DIAGNOSIS — F411 Generalized anxiety disorder: Secondary | ICD-10-CM | POA: Diagnosis not present

## 2017-09-25 DIAGNOSIS — E46 Unspecified protein-calorie malnutrition: Secondary | ICD-10-CM | POA: Diagnosis not present

## 2017-09-25 DIAGNOSIS — F0281 Dementia in other diseases classified elsewhere with behavioral disturbance: Secondary | ICD-10-CM | POA: Diagnosis not present

## 2017-09-25 DIAGNOSIS — F339 Major depressive disorder, recurrent, unspecified: Secondary | ICD-10-CM | POA: Diagnosis not present

## 2017-09-25 DIAGNOSIS — R634 Abnormal weight loss: Secondary | ICD-10-CM | POA: Diagnosis not present

## 2017-09-25 DIAGNOSIS — G309 Alzheimer's disease, unspecified: Secondary | ICD-10-CM | POA: Diagnosis not present

## 2017-09-26 DIAGNOSIS — G309 Alzheimer's disease, unspecified: Secondary | ICD-10-CM | POA: Diagnosis not present

## 2017-09-26 DIAGNOSIS — R634 Abnormal weight loss: Secondary | ICD-10-CM | POA: Diagnosis not present

## 2017-09-26 DIAGNOSIS — F411 Generalized anxiety disorder: Secondary | ICD-10-CM | POA: Diagnosis not present

## 2017-09-26 DIAGNOSIS — F0281 Dementia in other diseases classified elsewhere with behavioral disturbance: Secondary | ICD-10-CM | POA: Diagnosis not present

## 2017-09-26 DIAGNOSIS — F339 Major depressive disorder, recurrent, unspecified: Secondary | ICD-10-CM | POA: Diagnosis not present

## 2017-09-26 DIAGNOSIS — E46 Unspecified protein-calorie malnutrition: Secondary | ICD-10-CM | POA: Diagnosis not present

## 2017-09-27 DIAGNOSIS — G309 Alzheimer's disease, unspecified: Secondary | ICD-10-CM | POA: Diagnosis not present

## 2017-09-27 DIAGNOSIS — F411 Generalized anxiety disorder: Secondary | ICD-10-CM | POA: Diagnosis not present

## 2017-09-27 DIAGNOSIS — F0281 Dementia in other diseases classified elsewhere with behavioral disturbance: Secondary | ICD-10-CM | POA: Diagnosis not present

## 2017-09-27 DIAGNOSIS — R634 Abnormal weight loss: Secondary | ICD-10-CM | POA: Diagnosis not present

## 2017-09-27 DIAGNOSIS — F339 Major depressive disorder, recurrent, unspecified: Secondary | ICD-10-CM | POA: Diagnosis not present

## 2017-09-27 DIAGNOSIS — E46 Unspecified protein-calorie malnutrition: Secondary | ICD-10-CM | POA: Diagnosis not present

## 2017-09-30 DIAGNOSIS — F0281 Dementia in other diseases classified elsewhere with behavioral disturbance: Secondary | ICD-10-CM | POA: Diagnosis not present

## 2017-09-30 DIAGNOSIS — F339 Major depressive disorder, recurrent, unspecified: Secondary | ICD-10-CM | POA: Diagnosis not present

## 2017-09-30 DIAGNOSIS — G309 Alzheimer's disease, unspecified: Secondary | ICD-10-CM | POA: Diagnosis not present

## 2017-09-30 DIAGNOSIS — R634 Abnormal weight loss: Secondary | ICD-10-CM | POA: Diagnosis not present

## 2017-09-30 DIAGNOSIS — F411 Generalized anxiety disorder: Secondary | ICD-10-CM | POA: Diagnosis not present

## 2017-09-30 DIAGNOSIS — E46 Unspecified protein-calorie malnutrition: Secondary | ICD-10-CM | POA: Diagnosis not present

## 2017-10-02 DIAGNOSIS — E46 Unspecified protein-calorie malnutrition: Secondary | ICD-10-CM | POA: Diagnosis not present

## 2017-10-02 DIAGNOSIS — F0281 Dementia in other diseases classified elsewhere with behavioral disturbance: Secondary | ICD-10-CM | POA: Diagnosis not present

## 2017-10-02 DIAGNOSIS — F339 Major depressive disorder, recurrent, unspecified: Secondary | ICD-10-CM | POA: Diagnosis not present

## 2017-10-02 DIAGNOSIS — F411 Generalized anxiety disorder: Secondary | ICD-10-CM | POA: Diagnosis not present

## 2017-10-02 DIAGNOSIS — R634 Abnormal weight loss: Secondary | ICD-10-CM | POA: Diagnosis not present

## 2017-10-02 DIAGNOSIS — G309 Alzheimer's disease, unspecified: Secondary | ICD-10-CM | POA: Diagnosis not present

## 2017-10-03 DIAGNOSIS — F339 Major depressive disorder, recurrent, unspecified: Secondary | ICD-10-CM | POA: Diagnosis not present

## 2017-10-03 DIAGNOSIS — E46 Unspecified protein-calorie malnutrition: Secondary | ICD-10-CM | POA: Diagnosis not present

## 2017-10-03 DIAGNOSIS — F411 Generalized anxiety disorder: Secondary | ICD-10-CM | POA: Diagnosis not present

## 2017-10-03 DIAGNOSIS — G309 Alzheimer's disease, unspecified: Secondary | ICD-10-CM | POA: Diagnosis not present

## 2017-10-03 DIAGNOSIS — F0281 Dementia in other diseases classified elsewhere with behavioral disturbance: Secondary | ICD-10-CM | POA: Diagnosis not present

## 2017-10-03 DIAGNOSIS — R634 Abnormal weight loss: Secondary | ICD-10-CM | POA: Diagnosis not present

## 2017-10-04 DIAGNOSIS — F411 Generalized anxiety disorder: Secondary | ICD-10-CM | POA: Diagnosis not present

## 2017-10-04 DIAGNOSIS — G309 Alzheimer's disease, unspecified: Secondary | ICD-10-CM | POA: Diagnosis not present

## 2017-10-04 DIAGNOSIS — F0281 Dementia in other diseases classified elsewhere with behavioral disturbance: Secondary | ICD-10-CM | POA: Diagnosis not present

## 2017-10-04 DIAGNOSIS — F339 Major depressive disorder, recurrent, unspecified: Secondary | ICD-10-CM | POA: Diagnosis not present

## 2017-10-04 DIAGNOSIS — E46 Unspecified protein-calorie malnutrition: Secondary | ICD-10-CM | POA: Diagnosis not present

## 2017-10-04 DIAGNOSIS — R634 Abnormal weight loss: Secondary | ICD-10-CM | POA: Diagnosis not present

## 2017-10-05 DIAGNOSIS — G309 Alzheimer's disease, unspecified: Secondary | ICD-10-CM | POA: Diagnosis not present

## 2017-10-05 DIAGNOSIS — F411 Generalized anxiety disorder: Secondary | ICD-10-CM | POA: Diagnosis not present

## 2017-10-05 DIAGNOSIS — F0281 Dementia in other diseases classified elsewhere with behavioral disturbance: Secondary | ICD-10-CM | POA: Diagnosis not present

## 2017-10-05 DIAGNOSIS — F339 Major depressive disorder, recurrent, unspecified: Secondary | ICD-10-CM | POA: Diagnosis not present

## 2017-10-05 DIAGNOSIS — R634 Abnormal weight loss: Secondary | ICD-10-CM | POA: Diagnosis not present

## 2017-10-05 DIAGNOSIS — E46 Unspecified protein-calorie malnutrition: Secondary | ICD-10-CM | POA: Diagnosis not present

## 2017-10-07 DIAGNOSIS — G309 Alzheimer's disease, unspecified: Secondary | ICD-10-CM | POA: Diagnosis not present

## 2017-10-07 DIAGNOSIS — F339 Major depressive disorder, recurrent, unspecified: Secondary | ICD-10-CM | POA: Diagnosis not present

## 2017-10-07 DIAGNOSIS — E46 Unspecified protein-calorie malnutrition: Secondary | ICD-10-CM | POA: Diagnosis not present

## 2017-10-07 DIAGNOSIS — F411 Generalized anxiety disorder: Secondary | ICD-10-CM | POA: Diagnosis not present

## 2017-10-07 DIAGNOSIS — F0281 Dementia in other diseases classified elsewhere with behavioral disturbance: Secondary | ICD-10-CM | POA: Diagnosis not present

## 2017-10-07 DIAGNOSIS — R634 Abnormal weight loss: Secondary | ICD-10-CM | POA: Diagnosis not present

## 2017-10-09 DIAGNOSIS — E46 Unspecified protein-calorie malnutrition: Secondary | ICD-10-CM | POA: Diagnosis not present

## 2017-10-09 DIAGNOSIS — R634 Abnormal weight loss: Secondary | ICD-10-CM | POA: Diagnosis not present

## 2017-10-09 DIAGNOSIS — F339 Major depressive disorder, recurrent, unspecified: Secondary | ICD-10-CM | POA: Diagnosis not present

## 2017-10-09 DIAGNOSIS — F411 Generalized anxiety disorder: Secondary | ICD-10-CM | POA: Diagnosis not present

## 2017-10-09 DIAGNOSIS — F0281 Dementia in other diseases classified elsewhere with behavioral disturbance: Secondary | ICD-10-CM | POA: Diagnosis not present

## 2017-10-09 DIAGNOSIS — G309 Alzheimer's disease, unspecified: Secondary | ICD-10-CM | POA: Diagnosis not present

## 2017-10-15 DIAGNOSIS — R634 Abnormal weight loss: Secondary | ICD-10-CM | POA: Diagnosis not present

## 2017-10-15 DIAGNOSIS — F0281 Dementia in other diseases classified elsewhere with behavioral disturbance: Secondary | ICD-10-CM | POA: Diagnosis not present

## 2017-10-15 DIAGNOSIS — G309 Alzheimer's disease, unspecified: Secondary | ICD-10-CM | POA: Diagnosis not present

## 2017-10-15 DIAGNOSIS — F411 Generalized anxiety disorder: Secondary | ICD-10-CM | POA: Diagnosis not present

## 2017-10-15 DIAGNOSIS — F339 Major depressive disorder, recurrent, unspecified: Secondary | ICD-10-CM | POA: Diagnosis not present

## 2017-10-15 DIAGNOSIS — E46 Unspecified protein-calorie malnutrition: Secondary | ICD-10-CM | POA: Diagnosis not present

## 2017-10-17 DIAGNOSIS — G309 Alzheimer's disease, unspecified: Secondary | ICD-10-CM | POA: Diagnosis not present

## 2017-10-17 DIAGNOSIS — E46 Unspecified protein-calorie malnutrition: Secondary | ICD-10-CM | POA: Diagnosis not present

## 2017-10-17 DIAGNOSIS — R634 Abnormal weight loss: Secondary | ICD-10-CM | POA: Diagnosis not present

## 2017-10-17 DIAGNOSIS — F411 Generalized anxiety disorder: Secondary | ICD-10-CM | POA: Diagnosis not present

## 2017-10-17 DIAGNOSIS — F339 Major depressive disorder, recurrent, unspecified: Secondary | ICD-10-CM | POA: Diagnosis not present

## 2017-10-17 DIAGNOSIS — F0281 Dementia in other diseases classified elsewhere with behavioral disturbance: Secondary | ICD-10-CM | POA: Diagnosis not present

## 2017-10-18 DIAGNOSIS — F0281 Dementia in other diseases classified elsewhere with behavioral disturbance: Secondary | ICD-10-CM | POA: Diagnosis not present

## 2017-10-18 DIAGNOSIS — F411 Generalized anxiety disorder: Secondary | ICD-10-CM | POA: Diagnosis not present

## 2017-10-18 DIAGNOSIS — F339 Major depressive disorder, recurrent, unspecified: Secondary | ICD-10-CM | POA: Diagnosis not present

## 2017-10-18 DIAGNOSIS — R634 Abnormal weight loss: Secondary | ICD-10-CM | POA: Diagnosis not present

## 2017-10-18 DIAGNOSIS — E46 Unspecified protein-calorie malnutrition: Secondary | ICD-10-CM | POA: Diagnosis not present

## 2017-10-18 DIAGNOSIS — G309 Alzheimer's disease, unspecified: Secondary | ICD-10-CM | POA: Diagnosis not present

## 2017-10-20 DIAGNOSIS — F339 Major depressive disorder, recurrent, unspecified: Secondary | ICD-10-CM | POA: Diagnosis not present

## 2017-10-20 DIAGNOSIS — F411 Generalized anxiety disorder: Secondary | ICD-10-CM | POA: Diagnosis not present

## 2017-10-20 DIAGNOSIS — E46 Unspecified protein-calorie malnutrition: Secondary | ICD-10-CM | POA: Diagnosis not present

## 2017-10-20 DIAGNOSIS — R634 Abnormal weight loss: Secondary | ICD-10-CM | POA: Diagnosis not present

## 2017-10-20 DIAGNOSIS — G309 Alzheimer's disease, unspecified: Secondary | ICD-10-CM | POA: Diagnosis not present

## 2017-10-20 DIAGNOSIS — F0281 Dementia in other diseases classified elsewhere with behavioral disturbance: Secondary | ICD-10-CM | POA: Diagnosis not present

## 2017-10-22 DIAGNOSIS — R634 Abnormal weight loss: Secondary | ICD-10-CM | POA: Diagnosis not present

## 2017-10-22 DIAGNOSIS — F411 Generalized anxiety disorder: Secondary | ICD-10-CM | POA: Diagnosis not present

## 2017-10-22 DIAGNOSIS — F339 Major depressive disorder, recurrent, unspecified: Secondary | ICD-10-CM | POA: Diagnosis not present

## 2017-10-22 DIAGNOSIS — E46 Unspecified protein-calorie malnutrition: Secondary | ICD-10-CM | POA: Diagnosis not present

## 2017-10-22 DIAGNOSIS — F0281 Dementia in other diseases classified elsewhere with behavioral disturbance: Secondary | ICD-10-CM | POA: Diagnosis not present

## 2017-10-22 DIAGNOSIS — G309 Alzheimer's disease, unspecified: Secondary | ICD-10-CM | POA: Diagnosis not present

## 2017-10-24 DIAGNOSIS — G309 Alzheimer's disease, unspecified: Secondary | ICD-10-CM | POA: Diagnosis not present

## 2017-10-24 DIAGNOSIS — F0281 Dementia in other diseases classified elsewhere with behavioral disturbance: Secondary | ICD-10-CM | POA: Diagnosis not present

## 2017-10-24 DIAGNOSIS — F411 Generalized anxiety disorder: Secondary | ICD-10-CM | POA: Diagnosis not present

## 2017-10-24 DIAGNOSIS — E46 Unspecified protein-calorie malnutrition: Secondary | ICD-10-CM | POA: Diagnosis not present

## 2017-10-24 DIAGNOSIS — F339 Major depressive disorder, recurrent, unspecified: Secondary | ICD-10-CM | POA: Diagnosis not present

## 2017-10-24 DIAGNOSIS — R634 Abnormal weight loss: Secondary | ICD-10-CM | POA: Diagnosis not present

## 2017-10-27 DIAGNOSIS — G309 Alzheimer's disease, unspecified: Secondary | ICD-10-CM | POA: Diagnosis not present

## 2017-10-27 DIAGNOSIS — F411 Generalized anxiety disorder: Secondary | ICD-10-CM | POA: Diagnosis not present

## 2017-10-27 DIAGNOSIS — F0281 Dementia in other diseases classified elsewhere with behavioral disturbance: Secondary | ICD-10-CM | POA: Diagnosis not present

## 2017-10-27 DIAGNOSIS — F339 Major depressive disorder, recurrent, unspecified: Secondary | ICD-10-CM | POA: Diagnosis not present

## 2017-10-27 DIAGNOSIS — R634 Abnormal weight loss: Secondary | ICD-10-CM | POA: Diagnosis not present

## 2017-10-27 DIAGNOSIS — E46 Unspecified protein-calorie malnutrition: Secondary | ICD-10-CM | POA: Diagnosis not present

## 2017-10-29 DIAGNOSIS — F339 Major depressive disorder, recurrent, unspecified: Secondary | ICD-10-CM | POA: Diagnosis not present

## 2017-10-29 DIAGNOSIS — G309 Alzheimer's disease, unspecified: Secondary | ICD-10-CM | POA: Diagnosis not present

## 2017-10-29 DIAGNOSIS — E46 Unspecified protein-calorie malnutrition: Secondary | ICD-10-CM | POA: Diagnosis not present

## 2017-10-29 DIAGNOSIS — F411 Generalized anxiety disorder: Secondary | ICD-10-CM | POA: Diagnosis not present

## 2017-10-29 DIAGNOSIS — F0281 Dementia in other diseases classified elsewhere with behavioral disturbance: Secondary | ICD-10-CM | POA: Diagnosis not present

## 2017-10-29 DIAGNOSIS — R634 Abnormal weight loss: Secondary | ICD-10-CM | POA: Diagnosis not present

## 2017-10-31 DIAGNOSIS — G309 Alzheimer's disease, unspecified: Secondary | ICD-10-CM | POA: Diagnosis not present

## 2017-10-31 DIAGNOSIS — E46 Unspecified protein-calorie malnutrition: Secondary | ICD-10-CM | POA: Diagnosis not present

## 2017-10-31 DIAGNOSIS — F339 Major depressive disorder, recurrent, unspecified: Secondary | ICD-10-CM | POA: Diagnosis not present

## 2017-10-31 DIAGNOSIS — R634 Abnormal weight loss: Secondary | ICD-10-CM | POA: Diagnosis not present

## 2017-10-31 DIAGNOSIS — F0281 Dementia in other diseases classified elsewhere with behavioral disturbance: Secondary | ICD-10-CM | POA: Diagnosis not present

## 2017-10-31 DIAGNOSIS — F411 Generalized anxiety disorder: Secondary | ICD-10-CM | POA: Diagnosis not present

## 2017-11-05 DIAGNOSIS — G309 Alzheimer's disease, unspecified: Secondary | ICD-10-CM | POA: Diagnosis not present

## 2017-11-05 DIAGNOSIS — F0281 Dementia in other diseases classified elsewhere with behavioral disturbance: Secondary | ICD-10-CM | POA: Diagnosis not present

## 2017-11-05 DIAGNOSIS — F339 Major depressive disorder, recurrent, unspecified: Secondary | ICD-10-CM | POA: Diagnosis not present

## 2017-11-05 DIAGNOSIS — R634 Abnormal weight loss: Secondary | ICD-10-CM | POA: Diagnosis not present

## 2017-11-05 DIAGNOSIS — E46 Unspecified protein-calorie malnutrition: Secondary | ICD-10-CM | POA: Diagnosis not present

## 2017-11-05 DIAGNOSIS — F411 Generalized anxiety disorder: Secondary | ICD-10-CM | POA: Diagnosis not present

## 2017-11-07 DIAGNOSIS — G309 Alzheimer's disease, unspecified: Secondary | ICD-10-CM | POA: Diagnosis not present

## 2017-11-07 DIAGNOSIS — F0281 Dementia in other diseases classified elsewhere with behavioral disturbance: Secondary | ICD-10-CM | POA: Diagnosis not present

## 2017-11-07 DIAGNOSIS — R634 Abnormal weight loss: Secondary | ICD-10-CM | POA: Diagnosis not present

## 2017-11-07 DIAGNOSIS — F339 Major depressive disorder, recurrent, unspecified: Secondary | ICD-10-CM | POA: Diagnosis not present

## 2017-11-07 DIAGNOSIS — F411 Generalized anxiety disorder: Secondary | ICD-10-CM | POA: Diagnosis not present

## 2017-11-07 DIAGNOSIS — E46 Unspecified protein-calorie malnutrition: Secondary | ICD-10-CM | POA: Diagnosis not present

## 2017-11-11 DIAGNOSIS — E46 Unspecified protein-calorie malnutrition: Secondary | ICD-10-CM | POA: Diagnosis not present

## 2017-11-11 DIAGNOSIS — F0281 Dementia in other diseases classified elsewhere with behavioral disturbance: Secondary | ICD-10-CM | POA: Diagnosis not present

## 2017-11-11 DIAGNOSIS — F339 Major depressive disorder, recurrent, unspecified: Secondary | ICD-10-CM | POA: Diagnosis not present

## 2017-11-11 DIAGNOSIS — F411 Generalized anxiety disorder: Secondary | ICD-10-CM | POA: Diagnosis not present

## 2017-11-11 DIAGNOSIS — G309 Alzheimer's disease, unspecified: Secondary | ICD-10-CM | POA: Diagnosis not present

## 2017-11-11 DIAGNOSIS — R634 Abnormal weight loss: Secondary | ICD-10-CM | POA: Diagnosis not present

## 2017-11-12 DIAGNOSIS — F0281 Dementia in other diseases classified elsewhere with behavioral disturbance: Secondary | ICD-10-CM | POA: Diagnosis not present

## 2017-11-12 DIAGNOSIS — F411 Generalized anxiety disorder: Secondary | ICD-10-CM | POA: Diagnosis not present

## 2017-11-12 DIAGNOSIS — F339 Major depressive disorder, recurrent, unspecified: Secondary | ICD-10-CM | POA: Diagnosis not present

## 2017-11-12 DIAGNOSIS — G309 Alzheimer's disease, unspecified: Secondary | ICD-10-CM | POA: Diagnosis not present

## 2017-11-12 DIAGNOSIS — E46 Unspecified protein-calorie malnutrition: Secondary | ICD-10-CM | POA: Diagnosis not present

## 2017-11-12 DIAGNOSIS — R634 Abnormal weight loss: Secondary | ICD-10-CM | POA: Diagnosis not present

## 2017-11-14 DIAGNOSIS — R634 Abnormal weight loss: Secondary | ICD-10-CM | POA: Diagnosis not present

## 2017-11-14 DIAGNOSIS — G309 Alzheimer's disease, unspecified: Secondary | ICD-10-CM | POA: Diagnosis not present

## 2017-11-14 DIAGNOSIS — F339 Major depressive disorder, recurrent, unspecified: Secondary | ICD-10-CM | POA: Diagnosis not present

## 2017-11-14 DIAGNOSIS — F0281 Dementia in other diseases classified elsewhere with behavioral disturbance: Secondary | ICD-10-CM | POA: Diagnosis not present

## 2017-11-14 DIAGNOSIS — E46 Unspecified protein-calorie malnutrition: Secondary | ICD-10-CM | POA: Diagnosis not present

## 2017-11-14 DIAGNOSIS — F411 Generalized anxiety disorder: Secondary | ICD-10-CM | POA: Diagnosis not present

## 2017-11-17 DIAGNOSIS — F339 Major depressive disorder, recurrent, unspecified: Secondary | ICD-10-CM | POA: Diagnosis not present

## 2017-11-17 DIAGNOSIS — F411 Generalized anxiety disorder: Secondary | ICD-10-CM | POA: Diagnosis not present

## 2017-11-17 DIAGNOSIS — G309 Alzheimer's disease, unspecified: Secondary | ICD-10-CM | POA: Diagnosis not present

## 2017-11-17 DIAGNOSIS — F0281 Dementia in other diseases classified elsewhere with behavioral disturbance: Secondary | ICD-10-CM | POA: Diagnosis not present

## 2017-11-17 DIAGNOSIS — E46 Unspecified protein-calorie malnutrition: Secondary | ICD-10-CM | POA: Diagnosis not present

## 2017-11-17 DIAGNOSIS — R634 Abnormal weight loss: Secondary | ICD-10-CM | POA: Diagnosis not present

## 2017-11-18 ENCOUNTER — Telehealth: Payer: Self-pay | Admitting: *Deleted

## 2017-11-18 DIAGNOSIS — F411 Generalized anxiety disorder: Secondary | ICD-10-CM | POA: Diagnosis not present

## 2017-11-18 DIAGNOSIS — F0281 Dementia in other diseases classified elsewhere with behavioral disturbance: Secondary | ICD-10-CM | POA: Diagnosis not present

## 2017-11-18 DIAGNOSIS — F339 Major depressive disorder, recurrent, unspecified: Secondary | ICD-10-CM | POA: Diagnosis not present

## 2017-11-18 DIAGNOSIS — R634 Abnormal weight loss: Secondary | ICD-10-CM | POA: Diagnosis not present

## 2017-11-18 DIAGNOSIS — G309 Alzheimer's disease, unspecified: Secondary | ICD-10-CM | POA: Diagnosis not present

## 2017-11-18 DIAGNOSIS — E46 Unspecified protein-calorie malnutrition: Secondary | ICD-10-CM | POA: Diagnosis not present

## 2017-11-18 NOTE — Telephone Encounter (Signed)
That’s fine with me

## 2017-11-18 NOTE — Telephone Encounter (Signed)
Fax from [facility] to change mighty shake BID to Ensure BID. [phone] (p) [phone] (f)

## 2017-11-19 DIAGNOSIS — F411 Generalized anxiety disorder: Secondary | ICD-10-CM | POA: Diagnosis not present

## 2017-11-19 DIAGNOSIS — G309 Alzheimer's disease, unspecified: Secondary | ICD-10-CM | POA: Diagnosis not present

## 2017-11-19 DIAGNOSIS — F0281 Dementia in other diseases classified elsewhere with behavioral disturbance: Secondary | ICD-10-CM | POA: Diagnosis not present

## 2017-11-19 DIAGNOSIS — F339 Major depressive disorder, recurrent, unspecified: Secondary | ICD-10-CM | POA: Diagnosis not present

## 2017-11-19 DIAGNOSIS — E46 Unspecified protein-calorie malnutrition: Secondary | ICD-10-CM | POA: Diagnosis not present

## 2017-11-19 DIAGNOSIS — R634 Abnormal weight loss: Secondary | ICD-10-CM | POA: Diagnosis not present

## 2017-11-19 NOTE — Telephone Encounter (Signed)
[facility] notified.

## 2017-11-21 DIAGNOSIS — R634 Abnormal weight loss: Secondary | ICD-10-CM | POA: Diagnosis not present

## 2017-11-21 DIAGNOSIS — F339 Major depressive disorder, recurrent, unspecified: Secondary | ICD-10-CM | POA: Diagnosis not present

## 2017-11-21 DIAGNOSIS — F0281 Dementia in other diseases classified elsewhere with behavioral disturbance: Secondary | ICD-10-CM | POA: Diagnosis not present

## 2017-11-21 DIAGNOSIS — G309 Alzheimer's disease, unspecified: Secondary | ICD-10-CM | POA: Diagnosis not present

## 2017-11-21 DIAGNOSIS — E46 Unspecified protein-calorie malnutrition: Secondary | ICD-10-CM | POA: Diagnosis not present

## 2017-11-21 DIAGNOSIS — F411 Generalized anxiety disorder: Secondary | ICD-10-CM | POA: Diagnosis not present

## 2017-11-22 DIAGNOSIS — F0281 Dementia in other diseases classified elsewhere with behavioral disturbance: Secondary | ICD-10-CM | POA: Diagnosis not present

## 2017-11-22 DIAGNOSIS — R634 Abnormal weight loss: Secondary | ICD-10-CM | POA: Diagnosis not present

## 2017-11-22 DIAGNOSIS — E46 Unspecified protein-calorie malnutrition: Secondary | ICD-10-CM | POA: Diagnosis not present

## 2017-11-22 DIAGNOSIS — G309 Alzheimer's disease, unspecified: Secondary | ICD-10-CM | POA: Diagnosis not present

## 2017-11-22 DIAGNOSIS — F339 Major depressive disorder, recurrent, unspecified: Secondary | ICD-10-CM | POA: Diagnosis not present

## 2017-11-22 DIAGNOSIS — F411 Generalized anxiety disorder: Secondary | ICD-10-CM | POA: Diagnosis not present

## 2017-11-24 DIAGNOSIS — F0281 Dementia in other diseases classified elsewhere with behavioral disturbance: Secondary | ICD-10-CM | POA: Diagnosis not present

## 2017-11-24 DIAGNOSIS — F411 Generalized anxiety disorder: Secondary | ICD-10-CM | POA: Diagnosis not present

## 2017-11-24 DIAGNOSIS — E46 Unspecified protein-calorie malnutrition: Secondary | ICD-10-CM | POA: Diagnosis not present

## 2017-11-24 DIAGNOSIS — G309 Alzheimer's disease, unspecified: Secondary | ICD-10-CM | POA: Diagnosis not present

## 2017-11-24 DIAGNOSIS — F339 Major depressive disorder, recurrent, unspecified: Secondary | ICD-10-CM | POA: Diagnosis not present

## 2017-11-24 DIAGNOSIS — R634 Abnormal weight loss: Secondary | ICD-10-CM | POA: Diagnosis not present

## 2017-11-26 DIAGNOSIS — R634 Abnormal weight loss: Secondary | ICD-10-CM | POA: Diagnosis not present

## 2017-11-26 DIAGNOSIS — G309 Alzheimer's disease, unspecified: Secondary | ICD-10-CM | POA: Diagnosis not present

## 2017-11-26 DIAGNOSIS — F0281 Dementia in other diseases classified elsewhere with behavioral disturbance: Secondary | ICD-10-CM | POA: Diagnosis not present

## 2017-11-26 DIAGNOSIS — F411 Generalized anxiety disorder: Secondary | ICD-10-CM | POA: Diagnosis not present

## 2017-11-26 DIAGNOSIS — E46 Unspecified protein-calorie malnutrition: Secondary | ICD-10-CM | POA: Diagnosis not present

## 2017-11-26 DIAGNOSIS — F339 Major depressive disorder, recurrent, unspecified: Secondary | ICD-10-CM | POA: Diagnosis not present

## 2017-11-28 DIAGNOSIS — R634 Abnormal weight loss: Secondary | ICD-10-CM | POA: Diagnosis not present

## 2017-11-28 DIAGNOSIS — G309 Alzheimer's disease, unspecified: Secondary | ICD-10-CM | POA: Diagnosis not present

## 2017-11-28 DIAGNOSIS — F411 Generalized anxiety disorder: Secondary | ICD-10-CM | POA: Diagnosis not present

## 2017-11-28 DIAGNOSIS — E46 Unspecified protein-calorie malnutrition: Secondary | ICD-10-CM | POA: Diagnosis not present

## 2017-11-28 DIAGNOSIS — F339 Major depressive disorder, recurrent, unspecified: Secondary | ICD-10-CM | POA: Diagnosis not present

## 2017-11-28 DIAGNOSIS — F0281 Dementia in other diseases classified elsewhere with behavioral disturbance: Secondary | ICD-10-CM | POA: Diagnosis not present

## 2017-12-03 DIAGNOSIS — F411 Generalized anxiety disorder: Secondary | ICD-10-CM | POA: Diagnosis not present

## 2017-12-03 DIAGNOSIS — F0281 Dementia in other diseases classified elsewhere with behavioral disturbance: Secondary | ICD-10-CM | POA: Diagnosis not present

## 2017-12-03 DIAGNOSIS — R634 Abnormal weight loss: Secondary | ICD-10-CM | POA: Diagnosis not present

## 2017-12-03 DIAGNOSIS — F339 Major depressive disorder, recurrent, unspecified: Secondary | ICD-10-CM | POA: Diagnosis not present

## 2017-12-03 DIAGNOSIS — E46 Unspecified protein-calorie malnutrition: Secondary | ICD-10-CM | POA: Diagnosis not present

## 2017-12-03 DIAGNOSIS — G309 Alzheimer's disease, unspecified: Secondary | ICD-10-CM | POA: Diagnosis not present

## 2017-12-05 DIAGNOSIS — F339 Major depressive disorder, recurrent, unspecified: Secondary | ICD-10-CM | POA: Diagnosis not present

## 2017-12-05 DIAGNOSIS — F411 Generalized anxiety disorder: Secondary | ICD-10-CM | POA: Diagnosis not present

## 2017-12-05 DIAGNOSIS — F0281 Dementia in other diseases classified elsewhere with behavioral disturbance: Secondary | ICD-10-CM | POA: Diagnosis not present

## 2017-12-05 DIAGNOSIS — G309 Alzheimer's disease, unspecified: Secondary | ICD-10-CM | POA: Diagnosis not present

## 2017-12-05 DIAGNOSIS — R634 Abnormal weight loss: Secondary | ICD-10-CM | POA: Diagnosis not present

## 2017-12-05 DIAGNOSIS — E46 Unspecified protein-calorie malnutrition: Secondary | ICD-10-CM | POA: Diagnosis not present

## 2017-12-09 DIAGNOSIS — F0281 Dementia in other diseases classified elsewhere with behavioral disturbance: Secondary | ICD-10-CM | POA: Diagnosis not present

## 2017-12-09 DIAGNOSIS — F411 Generalized anxiety disorder: Secondary | ICD-10-CM | POA: Diagnosis not present

## 2017-12-09 DIAGNOSIS — G309 Alzheimer's disease, unspecified: Secondary | ICD-10-CM | POA: Diagnosis not present

## 2017-12-09 DIAGNOSIS — E46 Unspecified protein-calorie malnutrition: Secondary | ICD-10-CM | POA: Diagnosis not present

## 2017-12-09 DIAGNOSIS — F339 Major depressive disorder, recurrent, unspecified: Secondary | ICD-10-CM | POA: Diagnosis not present

## 2017-12-09 DIAGNOSIS — R634 Abnormal weight loss: Secondary | ICD-10-CM | POA: Diagnosis not present

## 2017-12-10 DIAGNOSIS — F411 Generalized anxiety disorder: Secondary | ICD-10-CM | POA: Diagnosis not present

## 2017-12-10 DIAGNOSIS — F0281 Dementia in other diseases classified elsewhere with behavioral disturbance: Secondary | ICD-10-CM | POA: Diagnosis not present

## 2017-12-10 DIAGNOSIS — E46 Unspecified protein-calorie malnutrition: Secondary | ICD-10-CM | POA: Diagnosis not present

## 2017-12-10 DIAGNOSIS — F339 Major depressive disorder, recurrent, unspecified: Secondary | ICD-10-CM | POA: Diagnosis not present

## 2017-12-10 DIAGNOSIS — R634 Abnormal weight loss: Secondary | ICD-10-CM | POA: Diagnosis not present

## 2017-12-10 DIAGNOSIS — G309 Alzheimer's disease, unspecified: Secondary | ICD-10-CM | POA: Diagnosis not present

## 2017-12-12 DIAGNOSIS — R634 Abnormal weight loss: Secondary | ICD-10-CM | POA: Diagnosis not present

## 2017-12-12 DIAGNOSIS — F411 Generalized anxiety disorder: Secondary | ICD-10-CM | POA: Diagnosis not present

## 2017-12-12 DIAGNOSIS — G309 Alzheimer's disease, unspecified: Secondary | ICD-10-CM | POA: Diagnosis not present

## 2017-12-12 DIAGNOSIS — E46 Unspecified protein-calorie malnutrition: Secondary | ICD-10-CM | POA: Diagnosis not present

## 2017-12-12 DIAGNOSIS — F339 Major depressive disorder, recurrent, unspecified: Secondary | ICD-10-CM | POA: Diagnosis not present

## 2017-12-12 DIAGNOSIS — F0281 Dementia in other diseases classified elsewhere with behavioral disturbance: Secondary | ICD-10-CM | POA: Diagnosis not present

## 2017-12-15 DIAGNOSIS — F339 Major depressive disorder, recurrent, unspecified: Secondary | ICD-10-CM | POA: Diagnosis not present

## 2017-12-15 DIAGNOSIS — F0281 Dementia in other diseases classified elsewhere with behavioral disturbance: Secondary | ICD-10-CM | POA: Diagnosis not present

## 2017-12-15 DIAGNOSIS — F411 Generalized anxiety disorder: Secondary | ICD-10-CM | POA: Diagnosis not present

## 2017-12-15 DIAGNOSIS — R634 Abnormal weight loss: Secondary | ICD-10-CM | POA: Diagnosis not present

## 2017-12-15 DIAGNOSIS — G309 Alzheimer's disease, unspecified: Secondary | ICD-10-CM | POA: Diagnosis not present

## 2017-12-15 DIAGNOSIS — E46 Unspecified protein-calorie malnutrition: Secondary | ICD-10-CM | POA: Diagnosis not present

## 2017-12-17 DIAGNOSIS — F339 Major depressive disorder, recurrent, unspecified: Secondary | ICD-10-CM | POA: Diagnosis not present

## 2017-12-17 DIAGNOSIS — E46 Unspecified protein-calorie malnutrition: Secondary | ICD-10-CM | POA: Diagnosis not present

## 2017-12-17 DIAGNOSIS — F0281 Dementia in other diseases classified elsewhere with behavioral disturbance: Secondary | ICD-10-CM | POA: Diagnosis not present

## 2017-12-17 DIAGNOSIS — R634 Abnormal weight loss: Secondary | ICD-10-CM | POA: Diagnosis not present

## 2017-12-17 DIAGNOSIS — G309 Alzheimer's disease, unspecified: Secondary | ICD-10-CM | POA: Diagnosis not present

## 2017-12-17 DIAGNOSIS — F411 Generalized anxiety disorder: Secondary | ICD-10-CM | POA: Diagnosis not present

## 2017-12-18 DIAGNOSIS — R634 Abnormal weight loss: Secondary | ICD-10-CM | POA: Diagnosis not present

## 2017-12-18 DIAGNOSIS — F411 Generalized anxiety disorder: Secondary | ICD-10-CM | POA: Diagnosis not present

## 2017-12-18 DIAGNOSIS — F339 Major depressive disorder, recurrent, unspecified: Secondary | ICD-10-CM | POA: Diagnosis not present

## 2017-12-18 DIAGNOSIS — E46 Unspecified protein-calorie malnutrition: Secondary | ICD-10-CM | POA: Diagnosis not present

## 2017-12-18 DIAGNOSIS — F0281 Dementia in other diseases classified elsewhere with behavioral disturbance: Secondary | ICD-10-CM | POA: Diagnosis not present

## 2017-12-18 DIAGNOSIS — G309 Alzheimer's disease, unspecified: Secondary | ICD-10-CM | POA: Diagnosis not present

## 2017-12-19 DIAGNOSIS — G309 Alzheimer's disease, unspecified: Secondary | ICD-10-CM | POA: Diagnosis not present

## 2017-12-19 DIAGNOSIS — F411 Generalized anxiety disorder: Secondary | ICD-10-CM | POA: Diagnosis not present

## 2017-12-19 DIAGNOSIS — F339 Major depressive disorder, recurrent, unspecified: Secondary | ICD-10-CM | POA: Diagnosis not present

## 2017-12-19 DIAGNOSIS — F0281 Dementia in other diseases classified elsewhere with behavioral disturbance: Secondary | ICD-10-CM | POA: Diagnosis not present

## 2017-12-19 DIAGNOSIS — E46 Unspecified protein-calorie malnutrition: Secondary | ICD-10-CM | POA: Diagnosis not present

## 2017-12-19 DIAGNOSIS — R634 Abnormal weight loss: Secondary | ICD-10-CM | POA: Diagnosis not present

## 2017-12-20 DIAGNOSIS — E46 Unspecified protein-calorie malnutrition: Secondary | ICD-10-CM | POA: Diagnosis not present

## 2017-12-20 DIAGNOSIS — F339 Major depressive disorder, recurrent, unspecified: Secondary | ICD-10-CM | POA: Diagnosis not present

## 2017-12-20 DIAGNOSIS — R634 Abnormal weight loss: Secondary | ICD-10-CM | POA: Diagnosis not present

## 2017-12-20 DIAGNOSIS — F411 Generalized anxiety disorder: Secondary | ICD-10-CM | POA: Diagnosis not present

## 2017-12-20 DIAGNOSIS — F0281 Dementia in other diseases classified elsewhere with behavioral disturbance: Secondary | ICD-10-CM | POA: Diagnosis not present

## 2017-12-20 DIAGNOSIS — G309 Alzheimer's disease, unspecified: Secondary | ICD-10-CM | POA: Diagnosis not present

## 2017-12-24 DIAGNOSIS — F0281 Dementia in other diseases classified elsewhere with behavioral disturbance: Secondary | ICD-10-CM | POA: Diagnosis not present

## 2017-12-24 DIAGNOSIS — E46 Unspecified protein-calorie malnutrition: Secondary | ICD-10-CM | POA: Diagnosis not present

## 2017-12-24 DIAGNOSIS — F339 Major depressive disorder, recurrent, unspecified: Secondary | ICD-10-CM | POA: Diagnosis not present

## 2017-12-24 DIAGNOSIS — G309 Alzheimer's disease, unspecified: Secondary | ICD-10-CM | POA: Diagnosis not present

## 2017-12-24 DIAGNOSIS — R634 Abnormal weight loss: Secondary | ICD-10-CM | POA: Diagnosis not present

## 2017-12-24 DIAGNOSIS — F411 Generalized anxiety disorder: Secondary | ICD-10-CM | POA: Diagnosis not present

## 2017-12-25 ENCOUNTER — Encounter: Payer: Medicare Other | Admitting: Internal Medicine

## 2017-12-26 DIAGNOSIS — R634 Abnormal weight loss: Secondary | ICD-10-CM | POA: Diagnosis not present

## 2017-12-26 DIAGNOSIS — G309 Alzheimer's disease, unspecified: Secondary | ICD-10-CM | POA: Diagnosis not present

## 2017-12-26 DIAGNOSIS — F411 Generalized anxiety disorder: Secondary | ICD-10-CM | POA: Diagnosis not present

## 2017-12-26 DIAGNOSIS — F339 Major depressive disorder, recurrent, unspecified: Secondary | ICD-10-CM | POA: Diagnosis not present

## 2017-12-26 DIAGNOSIS — E46 Unspecified protein-calorie malnutrition: Secondary | ICD-10-CM | POA: Diagnosis not present

## 2017-12-26 DIAGNOSIS — F0281 Dementia in other diseases classified elsewhere with behavioral disturbance: Secondary | ICD-10-CM | POA: Diagnosis not present

## 2017-12-31 DIAGNOSIS — R634 Abnormal weight loss: Secondary | ICD-10-CM | POA: Diagnosis not present

## 2017-12-31 DIAGNOSIS — F339 Major depressive disorder, recurrent, unspecified: Secondary | ICD-10-CM | POA: Diagnosis not present

## 2017-12-31 DIAGNOSIS — F0281 Dementia in other diseases classified elsewhere with behavioral disturbance: Secondary | ICD-10-CM | POA: Diagnosis not present

## 2017-12-31 DIAGNOSIS — G309 Alzheimer's disease, unspecified: Secondary | ICD-10-CM | POA: Diagnosis not present

## 2017-12-31 DIAGNOSIS — F411 Generalized anxiety disorder: Secondary | ICD-10-CM | POA: Diagnosis not present

## 2017-12-31 DIAGNOSIS — E46 Unspecified protein-calorie malnutrition: Secondary | ICD-10-CM | POA: Diagnosis not present

## 2018-01-02 DIAGNOSIS — E46 Unspecified protein-calorie malnutrition: Secondary | ICD-10-CM | POA: Diagnosis not present

## 2018-01-02 DIAGNOSIS — R634 Abnormal weight loss: Secondary | ICD-10-CM | POA: Diagnosis not present

## 2018-01-02 DIAGNOSIS — F339 Major depressive disorder, recurrent, unspecified: Secondary | ICD-10-CM | POA: Diagnosis not present

## 2018-01-02 DIAGNOSIS — F411 Generalized anxiety disorder: Secondary | ICD-10-CM | POA: Diagnosis not present

## 2018-01-02 DIAGNOSIS — G309 Alzheimer's disease, unspecified: Secondary | ICD-10-CM | POA: Diagnosis not present

## 2018-01-02 DIAGNOSIS — F0281 Dementia in other diseases classified elsewhere with behavioral disturbance: Secondary | ICD-10-CM | POA: Diagnosis not present

## 2018-01-07 DIAGNOSIS — F411 Generalized anxiety disorder: Secondary | ICD-10-CM | POA: Diagnosis not present

## 2018-01-07 DIAGNOSIS — E46 Unspecified protein-calorie malnutrition: Secondary | ICD-10-CM | POA: Diagnosis not present

## 2018-01-07 DIAGNOSIS — G309 Alzheimer's disease, unspecified: Secondary | ICD-10-CM | POA: Diagnosis not present

## 2018-01-07 DIAGNOSIS — F339 Major depressive disorder, recurrent, unspecified: Secondary | ICD-10-CM | POA: Diagnosis not present

## 2018-01-07 DIAGNOSIS — F0281 Dementia in other diseases classified elsewhere with behavioral disturbance: Secondary | ICD-10-CM | POA: Diagnosis not present

## 2018-01-07 DIAGNOSIS — R634 Abnormal weight loss: Secondary | ICD-10-CM | POA: Diagnosis not present

## 2018-01-08 DIAGNOSIS — F339 Major depressive disorder, recurrent, unspecified: Secondary | ICD-10-CM | POA: Diagnosis not present

## 2018-01-08 DIAGNOSIS — R634 Abnormal weight loss: Secondary | ICD-10-CM | POA: Diagnosis not present

## 2018-01-08 DIAGNOSIS — F0281 Dementia in other diseases classified elsewhere with behavioral disturbance: Secondary | ICD-10-CM | POA: Diagnosis not present

## 2018-01-08 DIAGNOSIS — E46 Unspecified protein-calorie malnutrition: Secondary | ICD-10-CM | POA: Diagnosis not present

## 2018-01-08 DIAGNOSIS — F411 Generalized anxiety disorder: Secondary | ICD-10-CM | POA: Diagnosis not present

## 2018-01-08 DIAGNOSIS — G309 Alzheimer's disease, unspecified: Secondary | ICD-10-CM | POA: Diagnosis not present

## 2018-01-09 DIAGNOSIS — R634 Abnormal weight loss: Secondary | ICD-10-CM | POA: Diagnosis not present

## 2018-01-09 DIAGNOSIS — F0281 Dementia in other diseases classified elsewhere with behavioral disturbance: Secondary | ICD-10-CM | POA: Diagnosis not present

## 2018-01-09 DIAGNOSIS — G309 Alzheimer's disease, unspecified: Secondary | ICD-10-CM | POA: Diagnosis not present

## 2018-01-09 DIAGNOSIS — E46 Unspecified protein-calorie malnutrition: Secondary | ICD-10-CM | POA: Diagnosis not present

## 2018-01-09 DIAGNOSIS — F339 Major depressive disorder, recurrent, unspecified: Secondary | ICD-10-CM | POA: Diagnosis not present

## 2018-01-09 DIAGNOSIS — F411 Generalized anxiety disorder: Secondary | ICD-10-CM | POA: Diagnosis not present

## 2018-01-12 DIAGNOSIS — F0281 Dementia in other diseases classified elsewhere with behavioral disturbance: Secondary | ICD-10-CM | POA: Diagnosis not present

## 2018-01-12 DIAGNOSIS — E46 Unspecified protein-calorie malnutrition: Secondary | ICD-10-CM | POA: Diagnosis not present

## 2018-01-12 DIAGNOSIS — R634 Abnormal weight loss: Secondary | ICD-10-CM | POA: Diagnosis not present

## 2018-01-12 DIAGNOSIS — G309 Alzheimer's disease, unspecified: Secondary | ICD-10-CM | POA: Diagnosis not present

## 2018-01-12 DIAGNOSIS — F339 Major depressive disorder, recurrent, unspecified: Secondary | ICD-10-CM | POA: Diagnosis not present

## 2018-01-12 DIAGNOSIS — F411 Generalized anxiety disorder: Secondary | ICD-10-CM | POA: Diagnosis not present

## 2018-01-14 DIAGNOSIS — F0281 Dementia in other diseases classified elsewhere with behavioral disturbance: Secondary | ICD-10-CM | POA: Diagnosis not present

## 2018-01-14 DIAGNOSIS — E46 Unspecified protein-calorie malnutrition: Secondary | ICD-10-CM | POA: Diagnosis not present

## 2018-01-14 DIAGNOSIS — G309 Alzheimer's disease, unspecified: Secondary | ICD-10-CM | POA: Diagnosis not present

## 2018-01-14 DIAGNOSIS — F339 Major depressive disorder, recurrent, unspecified: Secondary | ICD-10-CM | POA: Diagnosis not present

## 2018-01-14 DIAGNOSIS — F411 Generalized anxiety disorder: Secondary | ICD-10-CM | POA: Diagnosis not present

## 2018-01-14 DIAGNOSIS — R634 Abnormal weight loss: Secondary | ICD-10-CM | POA: Diagnosis not present

## 2018-01-16 ENCOUNTER — Telehealth: Payer: Self-pay

## 2018-01-16 DIAGNOSIS — R634 Abnormal weight loss: Secondary | ICD-10-CM | POA: Diagnosis not present

## 2018-01-16 DIAGNOSIS — E46 Unspecified protein-calorie malnutrition: Secondary | ICD-10-CM | POA: Diagnosis not present

## 2018-01-16 DIAGNOSIS — F339 Major depressive disorder, recurrent, unspecified: Secondary | ICD-10-CM | POA: Diagnosis not present

## 2018-01-16 DIAGNOSIS — G309 Alzheimer's disease, unspecified: Secondary | ICD-10-CM | POA: Diagnosis not present

## 2018-01-16 DIAGNOSIS — F411 Generalized anxiety disorder: Secondary | ICD-10-CM | POA: Diagnosis not present

## 2018-01-16 DIAGNOSIS — F0281 Dementia in other diseases classified elsewhere with behavioral disturbance: Secondary | ICD-10-CM | POA: Diagnosis not present

## 2018-01-16 NOTE — Telephone Encounter (Addendum)
[facility] requesting rx for Roxanol 20mg /mL, 5mg /0.6625mL by mouth every 4 hours as needed for pain and shortness of breath [facility] (medication will need to be added and sent by MD/DO for this is a controlled substance)   I inquired who rx'ed medication for it is not on current medication list. Last OV with [facility] was 08/22/16. Sherri Moses states a Hospice doctor added   Side note/research- please refer to faxes scanned under media from [facility], signed by Dr.Reed on  11/06/17 and 11/10/17. Dr.Reed had noted that patient needs appointment for she did not feel comfortable dispensing medications when she had not seen patient. Dr.Reed also suggested that patient's attending physician be changed to Hospice for Hospice is adding medications and then she is being asked to prescribe.     Dr.Pandey-Please advise,   Sherri Moses aware Dr.Reed is out of office and I will forward to Dr.Pandey.

## 2018-01-16 NOTE — Telephone Encounter (Signed)
If Dr Renato Gailseed has not seen this patient since 08/22/16 and has requested for patient to be seen in the clinic, I would like for hospice to contact the hospice physician to approve of this order until patient is seen by Dr Renato Gailseed.

## 2018-01-16 NOTE — Telephone Encounter (Signed)
I called Sherri Moses back and she indicated that she already called back and asked that we void request for she had already contacted a Hospice doctor.  Sherri Moses stated she was a fill in nurse and patient's primary nurse may know that she needs to contact Hospice doctor first.   S.Chrae B/CMA

## 2018-01-17 DIAGNOSIS — F339 Major depressive disorder, recurrent, unspecified: Secondary | ICD-10-CM | POA: Diagnosis not present

## 2018-01-17 DIAGNOSIS — F411 Generalized anxiety disorder: Secondary | ICD-10-CM | POA: Diagnosis not present

## 2018-01-17 DIAGNOSIS — F0281 Dementia in other diseases classified elsewhere with behavioral disturbance: Secondary | ICD-10-CM | POA: Diagnosis not present

## 2018-01-17 DIAGNOSIS — E46 Unspecified protein-calorie malnutrition: Secondary | ICD-10-CM | POA: Diagnosis not present

## 2018-01-17 DIAGNOSIS — R634 Abnormal weight loss: Secondary | ICD-10-CM | POA: Diagnosis not present

## 2018-01-17 DIAGNOSIS — G309 Alzheimer's disease, unspecified: Secondary | ICD-10-CM | POA: Diagnosis not present

## 2018-01-18 DIAGNOSIS — F0281 Dementia in other diseases classified elsewhere with behavioral disturbance: Secondary | ICD-10-CM | POA: Diagnosis not present

## 2018-01-18 DIAGNOSIS — F339 Major depressive disorder, recurrent, unspecified: Secondary | ICD-10-CM | POA: Diagnosis not present

## 2018-01-18 DIAGNOSIS — G309 Alzheimer's disease, unspecified: Secondary | ICD-10-CM | POA: Diagnosis not present

## 2018-01-18 DIAGNOSIS — E46 Unspecified protein-calorie malnutrition: Secondary | ICD-10-CM | POA: Diagnosis not present

## 2018-01-18 DIAGNOSIS — F411 Generalized anxiety disorder: Secondary | ICD-10-CM | POA: Diagnosis not present

## 2018-01-18 DIAGNOSIS — R634 Abnormal weight loss: Secondary | ICD-10-CM | POA: Diagnosis not present

## 2018-01-21 DIAGNOSIS — R634 Abnormal weight loss: Secondary | ICD-10-CM | POA: Diagnosis not present

## 2018-01-21 DIAGNOSIS — F339 Major depressive disorder, recurrent, unspecified: Secondary | ICD-10-CM | POA: Diagnosis not present

## 2018-01-21 DIAGNOSIS — F0281 Dementia in other diseases classified elsewhere with behavioral disturbance: Secondary | ICD-10-CM | POA: Diagnosis not present

## 2018-01-21 DIAGNOSIS — E46 Unspecified protein-calorie malnutrition: Secondary | ICD-10-CM | POA: Diagnosis not present

## 2018-01-21 DIAGNOSIS — G309 Alzheimer's disease, unspecified: Secondary | ICD-10-CM | POA: Diagnosis not present

## 2018-01-21 DIAGNOSIS — F411 Generalized anxiety disorder: Secondary | ICD-10-CM | POA: Diagnosis not present

## 2018-01-22 ENCOUNTER — Encounter: Payer: Self-pay | Admitting: Internal Medicine

## 2018-01-22 ENCOUNTER — Ambulatory Visit (INDEPENDENT_AMBULATORY_CARE_PROVIDER_SITE_OTHER): Payer: Medicare Other | Admitting: Internal Medicine

## 2018-01-22 ENCOUNTER — Ambulatory Visit: Payer: Medicare Other | Admitting: Internal Medicine

## 2018-01-22 VITALS — BP 128/80 | HR 111

## 2018-01-22 DIAGNOSIS — L89309 Pressure ulcer of unspecified buttock, unspecified stage: Secondary | ICD-10-CM

## 2018-01-22 DIAGNOSIS — F02818 Dementia in other diseases classified elsewhere, unspecified severity, with other behavioral disturbance: Secondary | ICD-10-CM

## 2018-01-22 DIAGNOSIS — R296 Repeated falls: Secondary | ICD-10-CM | POA: Diagnosis not present

## 2018-01-22 DIAGNOSIS — J449 Chronic obstructive pulmonary disease, unspecified: Secondary | ICD-10-CM

## 2018-01-22 DIAGNOSIS — F0281 Dementia in other diseases classified elsewhere with behavioral disturbance: Secondary | ICD-10-CM | POA: Diagnosis not present

## 2018-01-22 DIAGNOSIS — M159 Polyosteoarthritis, unspecified: Secondary | ICD-10-CM | POA: Diagnosis not present

## 2018-01-22 DIAGNOSIS — F3341 Major depressive disorder, recurrent, in partial remission: Secondary | ICD-10-CM | POA: Diagnosis not present

## 2018-01-22 DIAGNOSIS — G300 Alzheimer's disease with early onset: Secondary | ICD-10-CM | POA: Diagnosis not present

## 2018-01-22 NOTE — Progress Notes (Signed)
Location:  [facility] clinic Provider:  Dayon Witt L. Renato Gails, D.O., C.M.D.  Code Status: DNR, on hospice care Goals of Care:  Advanced Directives 05/29/2017  Does Patient Have a Medical Advance Directive? Yes  Type of Advance Directive Out of facility DNR (pink MOST or yellow form)  Does patient want to make changes to medical advance directive? -  Copy of Healthcare Power of Attorney in Chart? -  Pre-existing out of facility DNR order (yellow form or pink MOST form) Pink MOST form placed in chart (order not valid for inpatient use);Yellow form placed in chart (order not valid for inpatient use)     Chief Complaint  Patient presents with  . Medical Management of Chronic Issues    follow-up    HPI: Patient is a 67 y.o. female seen for an annual check-in due to controlled substances being prescribed for her end of life needs.  She has end stage early onset alzheimer's disease--she lives in memory care at [facility].  Pt being seen for an annual visit due to me being PCP because hospice will not fill the opioid and other controlled medications for her.  Pt does not do well being transported to the office--she cried through the visit due to pain being up in her wheelchair.  Spoke with pt's daughter-in-law, Baxter Hire, and goals of care remain purely comfort and nothing to prolong her life.  She is not to be put on any nutritional supplements.  She is not to be seen by the housecalls team.  Sounds like this may have temporarily occurred.  Baxter Hire indicates that pt has been near death on a few occasions, but has bounced back.  She continues to have periods of agitation and combativeness that require prn haldol use.  She's also on depakote sprinkles for mood stabilization.  She has ativan for anxiety daily.  She's got morphine sulfate for pain control--has back pain or pain in the pressure ulcer she's developing (per CNA)--pt could not be moved to check her buttocks at appt.  Also has mobic and tramadol for mild  pain.    Has bowel regimen with miralax and senokot-s, then has prn imodium for diarrhea.     Past Medical History:  Diagnosis Date  . Alzheimer disease 2008  . Anxiety   . COPD (chronic obstructive pulmonary disease) (HCC) 2013  . Depression   . Insomnia     Past Surgical History:  Procedure Laterality Date  . ABDOMINAL HYSTERECTOMY    . APPENDECTOMY    . carpeal tunnel repair    . CATARACT EXTRACTION, BILATERAL  July 2015  . CHOLECYSTECTOMY    . ROTATOR CUFF REPAIR      Allergies  Allergen Reactions  . Codeine Other (See Comments)    ON MAR    Outpatient Encounter Medications as of 01/22/2018  Medication Sig  . acetaminophen (TYLENOL) 500 MG tablet Take 500 mg by mouth 2 (two) times daily. And daily as needed for breakthrough  . divalproex (DEPAKOTE SPRINKLE) 125 MG capsule Take 250 mg by mouth 2 (two) times daily.  Marland Kitchen LORazepam (ATIVAN) 0.5 MG tablet Take 1 tablet (0.5 mg total) by mouth daily.  . polyethylene glycol (MIRALAX / GLYCOLAX) packet Take 17 g by mouth daily.  . polyethylene glycol powder (GLYCOLAX/MIRALAX) powder FILL CAP TO 17GM MARK, MIX WITH 4-8 OUNCES OF FLUID AND TAKE BY MOUTH EVERY DAY AS NEEDED FOR CONSTIPATION  . QUEtiapine (SEROQUEL) 25 MG tablet Take 12.5 mg by mouth 2 (two) times daily.  Marland Kitchen  sennosides-docusate sodium (SENOKOT-S) 8.6-50 MG tablet Take 1 tablet by mouth daily.  Marland Kitchen UNABLE TO FIND Med Name: Magic Cup by mouth three times daily  . [DISCONTINUED] AMBULATORY NON FORMULARY MEDICATION Ativan Gel 0.5mg  Sig: Topically to neck or arms every 6 hours as needed for agitation/anxiety  . [DISCONTINUED] ENSURE (ENSURE) Take 237 mLs by mouth 2 (two) times daily between meals.  . [DISCONTINUED] loperamide (IMODIUM A-D) 2 MG tablet 2 tablets by mouth 1st loose stool then 1 by mouth every other loose stool. Do not take more than 8 tablets in 24 hours  . [DISCONTINUED] meloxicam (MOBIC) 15 MG tablet Take 15 mg by mouth daily.   No facility-administered  encounter medications on file as of 01/22/2018.     Review of Systems:  Review of Systems  Constitutional: Positive for weight loss. Negative for chills and fever.  HENT: Negative for congestion.   Eyes: Positive for blurred vision.  Respiratory: Negative for shortness of breath.   Cardiovascular: Negative for chest pain, palpitations and leg swelling.  Gastrointestinal: Positive for constipation. Negative for abdominal pain, blood in stool, diarrhea and melena.  Genitourinary: Negative for dysuria.       Incontinence  Musculoskeletal: Positive for back pain, falls and joint pain.  Skin: Negative for itching and rash.       CNA reports pressure ulcer is on buttocks that has just started  Neurological: Negative for dizziness and loss of consciousness.  Endo/Heme/Allergies: Does not bruise/bleed easily.  Psychiatric/Behavioral: Positive for depression and memory loss. The patient is nervous/anxious.        Agitation and combativeness at times requiring haldol due to danger to self and others    Health Maintenance  Topic Date Due  . Hepatitis C Screening  09/19/1950  . PNA vac Low Risk Adult (2 of 2 - PPSV23) 07/23/2016  . INFLUENZA VACCINE  12/18/2017  . DEXA SCAN  Completed    Physical Exam: Vitals:   01/22/18 1331  BP: 128/80  Pulse: (!) 111  SpO2: 96%   There is no height or weight on file to calculate BMI.  She could not be weighed  Physical Exam  Constitutional: No distress.  Frail white female--appears maybe 110-120 lbs max now  Cardiovascular: Normal rate, regular rhythm, normal heart sounds and intact distal pulses.  tachy  Pulmonary/Chest: Effort normal and breath sounds normal.  Abdominal: Bowel sounds are normal.  Musculoskeletal: Normal range of motion.  Neurological:  Keeps eyes closed most of visit  Skin: Skin is warm. There is pallor.  I'm told there's a pressure ulcer on her buttocks--not able to view   Psychiatric:  Crying/moaning in pain when up in  wheelchair    Labs reviewed: Basic Metabolic Panel: Recent Labs    05/29/17 1023  NA 142  K 3.9  CL 109  CO2 29  GLUCOSE 91  BUN 15  CREATININE 0.43*  CALCIUM 8.9   Liver Function Tests: Recent Labs    05/29/17 1023  AST 32  ALT 46  ALKPHOS 49  BILITOT 0.6  PROT 6.7  ALBUMIN 3.9   No results for input(s): LIPASE, AMYLASE in the last 8760 hours. Recent Labs    05/29/17 1023  AMMONIA 13   CBC: Recent Labs    05/29/17 1023  WBC 7.2  NEUTROABS 4.3  HGB 12.7  HCT 39.7  MCV 92.8  PLT 169   Lipid Panel: No results for input(s): CHOL, HDL, LDLCALC, TRIG, CHOLHDL, LDLDIRECT in the last 8760 hours. Lab Results  Component Value Date   HGBA1C 5.8 (H) 03/09/2015    Assessment/Plan 1. Early onset Alzheimer's disease with behavioral disturbance -end stages now -has periods of agitation and combativeness -continues on necessary haldol and depakote plus daily ativan -goals are comfort-based with no supplements to be added to her regimen or meds/treatments that will prolong her life -she yells out in pain when upright in her wheelchair for prolonged times so will recommend she only be seen annually in clinic, is near end of life -weight continues to drop  2. Generalized osteoarthritis of multiple sites -has had OA of back and knees -cont pain regimen with tramadol, mobic and roxanol  3. Pressure injury of skin of buttock, unspecified injury stage, unspecified laterality -frequent rotation/positional changes, gel or foam mattress to prevent further breakdown recommended -suggestive of her poor prognosis  4. Recurrent major depressive disorder, in partial remission (HCC) -cont seroquel for this and psychosis related to it, no longer on SSRI or SNRI as not felt to benefit her anymore  5. Frequent falls -I have been out for two weeks so not aware of any recent falls but had been a regular pattern for a long time--seems she's no longer able to ambulate like she  did--appears extremely weak and frail now, very pale  6. Chronic obstructive pulmonary disease, unspecified COPD type (HCC) -noted historically, but no respiratory concerns  Labs/tests ordered:  No orders of the defined types were placed in this encounter.  Next appt:  1 year for med renewals--hospice provides majority of care   Zaquan Duffner L. Robertson Colclough, D.O. Geriatrics [facility] [address] [place], [place] [address] Cell Phone (Mon-Fri 8am-5pm):  [phone] On Call:  [phone] & follow prompts after 5pm & weekends Office Phone:  [phone] Office Fax:  [phone]

## 2018-01-23 DIAGNOSIS — F411 Generalized anxiety disorder: Secondary | ICD-10-CM | POA: Diagnosis not present

## 2018-01-23 DIAGNOSIS — G309 Alzheimer's disease, unspecified: Secondary | ICD-10-CM | POA: Diagnosis not present

## 2018-01-23 DIAGNOSIS — F339 Major depressive disorder, recurrent, unspecified: Secondary | ICD-10-CM | POA: Diagnosis not present

## 2018-01-23 DIAGNOSIS — R634 Abnormal weight loss: Secondary | ICD-10-CM | POA: Diagnosis not present

## 2018-01-23 DIAGNOSIS — F0281 Dementia in other diseases classified elsewhere with behavioral disturbance: Secondary | ICD-10-CM | POA: Diagnosis not present

## 2018-01-23 DIAGNOSIS — E46 Unspecified protein-calorie malnutrition: Secondary | ICD-10-CM | POA: Diagnosis not present

## 2018-01-27 DIAGNOSIS — F339 Major depressive disorder, recurrent, unspecified: Secondary | ICD-10-CM | POA: Diagnosis not present

## 2018-01-27 DIAGNOSIS — G309 Alzheimer's disease, unspecified: Secondary | ICD-10-CM | POA: Diagnosis not present

## 2018-01-27 DIAGNOSIS — R634 Abnormal weight loss: Secondary | ICD-10-CM | POA: Diagnosis not present

## 2018-01-27 DIAGNOSIS — E46 Unspecified protein-calorie malnutrition: Secondary | ICD-10-CM | POA: Diagnosis not present

## 2018-01-27 DIAGNOSIS — F0281 Dementia in other diseases classified elsewhere with behavioral disturbance: Secondary | ICD-10-CM | POA: Diagnosis not present

## 2018-01-27 DIAGNOSIS — F411 Generalized anxiety disorder: Secondary | ICD-10-CM | POA: Diagnosis not present

## 2018-01-28 DIAGNOSIS — E46 Unspecified protein-calorie malnutrition: Secondary | ICD-10-CM | POA: Diagnosis not present

## 2018-01-28 DIAGNOSIS — F411 Generalized anxiety disorder: Secondary | ICD-10-CM | POA: Diagnosis not present

## 2018-01-28 DIAGNOSIS — G309 Alzheimer's disease, unspecified: Secondary | ICD-10-CM | POA: Diagnosis not present

## 2018-01-28 DIAGNOSIS — R634 Abnormal weight loss: Secondary | ICD-10-CM | POA: Diagnosis not present

## 2018-01-28 DIAGNOSIS — F339 Major depressive disorder, recurrent, unspecified: Secondary | ICD-10-CM | POA: Diagnosis not present

## 2018-01-28 DIAGNOSIS — F0281 Dementia in other diseases classified elsewhere with behavioral disturbance: Secondary | ICD-10-CM | POA: Diagnosis not present

## 2018-01-30 DIAGNOSIS — F411 Generalized anxiety disorder: Secondary | ICD-10-CM | POA: Diagnosis not present

## 2018-01-30 DIAGNOSIS — E46 Unspecified protein-calorie malnutrition: Secondary | ICD-10-CM | POA: Diagnosis not present

## 2018-01-30 DIAGNOSIS — F0281 Dementia in other diseases classified elsewhere with behavioral disturbance: Secondary | ICD-10-CM | POA: Diagnosis not present

## 2018-01-30 DIAGNOSIS — R634 Abnormal weight loss: Secondary | ICD-10-CM | POA: Diagnosis not present

## 2018-01-30 DIAGNOSIS — G309 Alzheimer's disease, unspecified: Secondary | ICD-10-CM | POA: Diagnosis not present

## 2018-01-30 DIAGNOSIS — F339 Major depressive disorder, recurrent, unspecified: Secondary | ICD-10-CM | POA: Diagnosis not present

## 2018-02-02 ENCOUNTER — Other Ambulatory Visit: Payer: Self-pay | Admitting: *Deleted

## 2018-02-02 MED ORDER — LORAZEPAM 0.5 MG PO TABS: 0.5000 mg | ORAL_TABLET | Freq: Every day | ORAL | 5 refills | Status: AC

## 2018-02-02 NOTE — Telephone Encounter (Signed)
Received Fax from [facility] requesting hard copy Rx for Ativan. Printed and given to Dr. Renato Gailseed to review and sign.

## 2018-02-03 DIAGNOSIS — R634 Abnormal weight loss: Secondary | ICD-10-CM | POA: Diagnosis not present

## 2018-02-03 DIAGNOSIS — E46 Unspecified protein-calorie malnutrition: Secondary | ICD-10-CM | POA: Diagnosis not present

## 2018-02-03 DIAGNOSIS — F411 Generalized anxiety disorder: Secondary | ICD-10-CM | POA: Diagnosis not present

## 2018-02-03 DIAGNOSIS — F339 Major depressive disorder, recurrent, unspecified: Secondary | ICD-10-CM | POA: Diagnosis not present

## 2018-02-03 DIAGNOSIS — G309 Alzheimer's disease, unspecified: Secondary | ICD-10-CM | POA: Diagnosis not present

## 2018-02-03 DIAGNOSIS — F0281 Dementia in other diseases classified elsewhere with behavioral disturbance: Secondary | ICD-10-CM | POA: Diagnosis not present

## 2018-02-04 ENCOUNTER — Other Ambulatory Visit: Payer: Self-pay | Admitting: Internal Medicine

## 2018-02-04 DIAGNOSIS — E46 Unspecified protein-calorie malnutrition: Secondary | ICD-10-CM | POA: Diagnosis not present

## 2018-02-04 DIAGNOSIS — F339 Major depressive disorder, recurrent, unspecified: Secondary | ICD-10-CM | POA: Diagnosis not present

## 2018-02-04 DIAGNOSIS — F411 Generalized anxiety disorder: Secondary | ICD-10-CM | POA: Diagnosis not present

## 2018-02-04 DIAGNOSIS — R634 Abnormal weight loss: Secondary | ICD-10-CM | POA: Diagnosis not present

## 2018-02-04 DIAGNOSIS — F0281 Dementia in other diseases classified elsewhere with behavioral disturbance: Secondary | ICD-10-CM | POA: Diagnosis not present

## 2018-02-04 DIAGNOSIS — G309 Alzheimer's disease, unspecified: Secondary | ICD-10-CM | POA: Diagnosis not present

## 2018-02-05 DIAGNOSIS — G309 Alzheimer's disease, unspecified: Secondary | ICD-10-CM | POA: Diagnosis not present

## 2018-02-05 DIAGNOSIS — F0281 Dementia in other diseases classified elsewhere with behavioral disturbance: Secondary | ICD-10-CM | POA: Diagnosis not present

## 2018-02-05 DIAGNOSIS — R634 Abnormal weight loss: Secondary | ICD-10-CM | POA: Diagnosis not present

## 2018-02-05 DIAGNOSIS — F339 Major depressive disorder, recurrent, unspecified: Secondary | ICD-10-CM | POA: Diagnosis not present

## 2018-02-05 DIAGNOSIS — F411 Generalized anxiety disorder: Secondary | ICD-10-CM | POA: Diagnosis not present

## 2018-02-05 DIAGNOSIS — E46 Unspecified protein-calorie malnutrition: Secondary | ICD-10-CM | POA: Diagnosis not present

## 2018-02-06 DIAGNOSIS — R634 Abnormal weight loss: Secondary | ICD-10-CM | POA: Diagnosis not present

## 2018-02-06 DIAGNOSIS — E46 Unspecified protein-calorie malnutrition: Secondary | ICD-10-CM | POA: Diagnosis not present

## 2018-02-06 DIAGNOSIS — F0281 Dementia in other diseases classified elsewhere with behavioral disturbance: Secondary | ICD-10-CM | POA: Diagnosis not present

## 2018-02-06 DIAGNOSIS — F411 Generalized anxiety disorder: Secondary | ICD-10-CM | POA: Diagnosis not present

## 2018-02-06 DIAGNOSIS — F339 Major depressive disorder, recurrent, unspecified: Secondary | ICD-10-CM | POA: Diagnosis not present

## 2018-02-06 DIAGNOSIS — G309 Alzheimer's disease, unspecified: Secondary | ICD-10-CM | POA: Diagnosis not present

## 2018-02-09 ENCOUNTER — Other Ambulatory Visit: Payer: Self-pay | Admitting: Internal Medicine

## 2018-02-10 DIAGNOSIS — F0281 Dementia in other diseases classified elsewhere with behavioral disturbance: Secondary | ICD-10-CM | POA: Diagnosis not present

## 2018-02-10 DIAGNOSIS — F339 Major depressive disorder, recurrent, unspecified: Secondary | ICD-10-CM | POA: Diagnosis not present

## 2018-02-10 DIAGNOSIS — F411 Generalized anxiety disorder: Secondary | ICD-10-CM | POA: Diagnosis not present

## 2018-02-10 DIAGNOSIS — R634 Abnormal weight loss: Secondary | ICD-10-CM | POA: Diagnosis not present

## 2018-02-10 DIAGNOSIS — E46 Unspecified protein-calorie malnutrition: Secondary | ICD-10-CM | POA: Diagnosis not present

## 2018-02-10 DIAGNOSIS — G309 Alzheimer's disease, unspecified: Secondary | ICD-10-CM | POA: Diagnosis not present

## 2018-02-11 DIAGNOSIS — F0281 Dementia in other diseases classified elsewhere with behavioral disturbance: Secondary | ICD-10-CM | POA: Diagnosis not present

## 2018-02-11 DIAGNOSIS — E46 Unspecified protein-calorie malnutrition: Secondary | ICD-10-CM | POA: Diagnosis not present

## 2018-02-11 DIAGNOSIS — F411 Generalized anxiety disorder: Secondary | ICD-10-CM | POA: Diagnosis not present

## 2018-02-11 DIAGNOSIS — R634 Abnormal weight loss: Secondary | ICD-10-CM | POA: Diagnosis not present

## 2018-02-11 DIAGNOSIS — F339 Major depressive disorder, recurrent, unspecified: Secondary | ICD-10-CM | POA: Diagnosis not present

## 2018-02-11 DIAGNOSIS — G309 Alzheimer's disease, unspecified: Secondary | ICD-10-CM | POA: Diagnosis not present

## 2018-02-12 DIAGNOSIS — F411 Generalized anxiety disorder: Secondary | ICD-10-CM | POA: Diagnosis not present

## 2018-02-12 DIAGNOSIS — R634 Abnormal weight loss: Secondary | ICD-10-CM | POA: Diagnosis not present

## 2018-02-12 DIAGNOSIS — E46 Unspecified protein-calorie malnutrition: Secondary | ICD-10-CM | POA: Diagnosis not present

## 2018-02-12 DIAGNOSIS — F0281 Dementia in other diseases classified elsewhere with behavioral disturbance: Secondary | ICD-10-CM | POA: Diagnosis not present

## 2018-02-12 DIAGNOSIS — G309 Alzheimer's disease, unspecified: Secondary | ICD-10-CM | POA: Diagnosis not present

## 2018-02-12 DIAGNOSIS — F339 Major depressive disorder, recurrent, unspecified: Secondary | ICD-10-CM | POA: Diagnosis not present

## 2018-02-13 DIAGNOSIS — F0281 Dementia in other diseases classified elsewhere with behavioral disturbance: Secondary | ICD-10-CM | POA: Diagnosis not present

## 2018-02-13 DIAGNOSIS — F411 Generalized anxiety disorder: Secondary | ICD-10-CM | POA: Diagnosis not present

## 2018-02-13 DIAGNOSIS — G309 Alzheimer's disease, unspecified: Secondary | ICD-10-CM | POA: Diagnosis not present

## 2018-02-13 DIAGNOSIS — F339 Major depressive disorder, recurrent, unspecified: Secondary | ICD-10-CM | POA: Diagnosis not present

## 2018-02-13 DIAGNOSIS — R634 Abnormal weight loss: Secondary | ICD-10-CM | POA: Diagnosis not present

## 2018-02-13 DIAGNOSIS — E46 Unspecified protein-calorie malnutrition: Secondary | ICD-10-CM | POA: Diagnosis not present

## 2018-02-17 DEATH — deceased

## 2018-03-03 ENCOUNTER — Telehealth: Payer: Self-pay | Admitting: *Deleted

## 2018-03-03 NOTE — Telephone Encounter (Signed)
I am willing to sign it.  I wonder if it was actually brought to hospice since she was on hospice at [facility].  I would suggest they check there.

## 2018-03-03 NOTE — Telephone Encounter (Signed)
Funeral home calling asking about death certificate, per funeral home this was mailed 02/10/2018. I advised we don't have anything on this patient. He wanted to speak with medical records. He also wanted to know if Dr. Renato Gails would sign this if he starts the process over again?

## 2018-03-06 NOTE — Telephone Encounter (Signed)
Confirmed with medical records we did not receive this death certificate
